# Patient Record
Sex: Female | Born: 1950 | Race: White | Hispanic: No | Smoking: Never smoker
Health system: Southern US, Community
[De-identification: ages and names within clinical notes are randomized; demographics above are authoritative.]

## PROBLEM LIST (undated history)

## (undated) DIAGNOSIS — B1920 Unspecified viral hepatitis C without hepatic coma: Secondary | ICD-10-CM

## (undated) DIAGNOSIS — E119 Type 2 diabetes mellitus without complications: Secondary | ICD-10-CM

## (undated) DIAGNOSIS — M8580 Other specified disorders of bone density and structure, unspecified site: Secondary | ICD-10-CM

## (undated) DIAGNOSIS — R42 Dizziness and giddiness: Secondary | ICD-10-CM

## (undated) DIAGNOSIS — R0989 Other specified symptoms and signs involving the circulatory and respiratory systems: Secondary | ICD-10-CM

## (undated) DIAGNOSIS — R011 Cardiac murmur, unspecified: Secondary | ICD-10-CM

## (undated) HISTORY — PX: TUBAL LIGATION: SHX77

## (undated) HISTORY — DX: Unspecified viral hepatitis C without hepatic coma: B19.20

## (undated) HISTORY — DX: Other specified symptoms and signs involving the circulatory and respiratory systems: R09.89

## (undated) HISTORY — DX: Other specified disorders of bone density and structure, unspecified site: M85.80

## (undated) HISTORY — DX: Type 2 diabetes mellitus without complications: E11.9

---

## 1998-09-18 HISTORY — PX: EXCISIONAL HEMORRHOIDECTOMY: SHX1541

## 2003-07-17 ENCOUNTER — Encounter: Payer: Self-pay | Admitting: Internal Medicine

## 2005-10-23 ENCOUNTER — Ambulatory Visit: Payer: Self-pay | Admitting: Internal Medicine

## 2007-05-17 ENCOUNTER — Ambulatory Visit: Payer: Self-pay | Admitting: Internal Medicine

## 2009-03-15 ENCOUNTER — Ambulatory Visit: Payer: Self-pay | Admitting: Internal Medicine

## 2009-03-15 DIAGNOSIS — M949 Disorder of cartilage, unspecified: Secondary | ICD-10-CM

## 2009-03-15 DIAGNOSIS — M2550 Pain in unspecified joint: Secondary | ICD-10-CM | POA: Insufficient documentation

## 2009-03-15 DIAGNOSIS — M899 Disorder of bone, unspecified: Secondary | ICD-10-CM | POA: Insufficient documentation

## 2009-03-16 ENCOUNTER — Encounter: Payer: Self-pay | Admitting: Internal Medicine

## 2009-03-16 ENCOUNTER — Encounter (INDEPENDENT_AMBULATORY_CARE_PROVIDER_SITE_OTHER): Payer: Self-pay | Admitting: *Deleted

## 2009-03-17 ENCOUNTER — Encounter (INDEPENDENT_AMBULATORY_CARE_PROVIDER_SITE_OTHER): Payer: Self-pay | Admitting: *Deleted

## 2009-03-18 ENCOUNTER — Encounter (INDEPENDENT_AMBULATORY_CARE_PROVIDER_SITE_OTHER): Payer: Self-pay | Admitting: *Deleted

## 2009-04-13 ENCOUNTER — Encounter: Admission: RE | Admit: 2009-04-13 | Discharge: 2009-04-13 | Payer: Self-pay | Admitting: Internal Medicine

## 2009-04-13 LAB — HM MAMMOGRAPHY: HM Mammogram: NORMAL

## 2009-05-18 ENCOUNTER — Encounter: Payer: Self-pay | Admitting: Internal Medicine

## 2009-05-18 ENCOUNTER — Ambulatory Visit: Payer: Self-pay | Admitting: Family Medicine

## 2009-06-07 ENCOUNTER — Encounter (INDEPENDENT_AMBULATORY_CARE_PROVIDER_SITE_OTHER): Payer: Self-pay | Admitting: *Deleted

## 2010-10-09 ENCOUNTER — Encounter: Payer: Self-pay | Admitting: Internal Medicine

## 2010-10-10 ENCOUNTER — Encounter: Payer: Self-pay | Admitting: Internal Medicine

## 2010-10-16 LAB — CONVERTED CEMR LAB
ALT: 17 units/L (ref 0–35)
BUN: 8 mg/dL (ref 6–23)
Basophils Absolute: 0 10*3/uL (ref 0.0–0.1)
Bilirubin, Direct: 0.1 mg/dL (ref 0.0–0.3)
Calcium: 10.1 mg/dL (ref 8.4–10.5)
Chloride: 105 meq/L (ref 96–112)
Creatinine, Ser: 0.7 mg/dL (ref 0.4–1.2)
GFR calc non Af Amer: 91.97 mL/min (ref >60)
Glucose, Bld: 90 mg/dL (ref 70–99)
HCT: 32.8 % — ABNORMAL LOW (ref 36.0–46.0)
Hemoglobin: 10.5 g/dL — ABNORMAL LOW (ref 12.0–15.0)
Hgb A1c MFr Bld: 6.4 % (ref 4.6–6.5)
MCHC: 32.1 g/dL (ref 30.0–36.0)
MCV: 81 fL (ref 78.0–100.0)
Neutro Abs: 3.3 10*3/uL (ref 1.4–7.7)
Platelets: 172 10*3/uL (ref 150.0–400.0)
Potassium: 4.3 meq/L (ref 3.5–5.1)
RBC: 4.06 M/uL (ref 3.87–5.11)
RDW: 15.7 % — ABNORMAL HIGH (ref 11.5–14.6)
Sed Rate: 22 mm/hr (ref 0–22)
Uric Acid, Serum: 7.2 mg/dL — ABNORMAL HIGH (ref 2.4–7.0)
VLDL: 17.6 mg/dL (ref 0.0–40.0)
WBC: 6.8 10*3/uL (ref 4.5–10.5)

## 2011-01-16 ENCOUNTER — Ambulatory Visit (INDEPENDENT_AMBULATORY_CARE_PROVIDER_SITE_OTHER): Payer: Self-pay | Admitting: Family

## 2011-01-16 ENCOUNTER — Encounter: Payer: Self-pay | Admitting: Family

## 2011-01-16 VITALS — BP 118/70 | HR 78 | Temp 98.0°F | Resp 16 | Ht 65.98 in | Wt 135.0 lb

## 2011-01-16 DIAGNOSIS — R300 Dysuria: Secondary | ICD-10-CM

## 2011-01-16 DIAGNOSIS — R3915 Urgency of urination: Secondary | ICD-10-CM

## 2011-01-16 LAB — POCT URINALYSIS DIPSTICK
Glucose, UA: NEGATIVE
Ketones, UA: NEGATIVE
Leukocytes, UA: NEGATIVE
Nitrite, UA: NEGATIVE
pH, UA: 6.5

## 2011-01-16 MED ORDER — CIPROFLOXACIN HCL 500 MG PO TABS: 500.0000 mg | ORAL_TABLET | Freq: Two times a day (BID) | ORAL | Status: AC

## 2011-01-16 NOTE — Progress Notes (Signed)
  Subjective:    Patient ID: Natalie Rush, female    DOB: 1950-12-11, 60 y.o.   MRN: [id number]  HPI  Ms.  Rush is a 60 yr old female who presents with complaint of dysuria and left sided abdominal pain.  Symptoms are intermittent in nature and started about 1 week ago. She has had + frequency.Denies associated fever, or hematuria.  Denies hx of kidney stones, but both of her brothers have hx of stones.  She has not tried any medication for the pain.  She reports that her bowel movements have been normal and regular she uses metamucil daily to help prevent constipation.  Pt is a Naval architect.  Review of Systems See HPI  Past Medical History  Diagnosis Date  . 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 Osteopenia     History   Social History  . Marital Status: Divorced    Spouse Name: N/A    Number of Children: N/A  . 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 Smoking status: Never Smoker   . Smokeless tobacco: Not on file  . Alcohol Use: Yes     rarely  . Drug Use: Not on file  . Sexually Active: Not on file   Other Topics Concern  . Not on file   Social History Narrative   Regular exercise:  No    Past Surgical History  Procedure Date  . Abdominal hysterectomy   . Tubal ligation   . Excisional hemorrhoidectomy 2000    Family History  Problem Relation Age of Onset  . Diabetes Mother   . Heart disease Mother     CAD  . Alcohol abuse Father   . Hypertension Father   . Hepatitis Father   . Multiple myeloma Sister   . Diabetes Brother     Allergies  Allergen Reactions  . Demerol Nausea Only    No current outpatient prescriptions on file prior to visit.    BP 118/70  Pulse 78  Temp(Src) 98 F (36.7 C) (Oral)  Resp 16  Ht 5' 5.98" (1.676 m)  Wt 135 lb 0.6 oz (61.254 kg)  BMI 21.81 kg/m2       Objective:   Physical Exam  Constitutional:  She appears well-developed and well-nourished.  HENT:  Head: Normocephalic and atraumatic.  Cardiovascular: Normal rate and regular rhythm.   Pulmonary/Chest: Effort normal and breath sounds normal.  Abdominal: Soft. Bowel sounds are normal. She exhibits no distension and no mass. There is no tenderness. There is no rebound and no guarding.  Musculoskeletal:       Neg CVAT bilaterally.  No palpable bladder distension on exam.         Assessment & Plan:

## 2011-01-16 NOTE — Patient Instructions (Signed)
Call if your symptoms worsen or if they do not improve.  Call if you develop fever >100 degrees, nausea or vomitting.

## 2011-01-16 NOTE — Assessment & Plan Note (Signed)
UA unremarkable, (specifically negative for blood).  Will plan empiric treatment with cipro x 3 days.  (urine will be cultured)   Neg CVAT today, neg abdominal tenderness.  If symptoms worsen or do not improve, consider CT abdomen.  Case was discussed with Dr. Artist Pais.

## 2011-01-17 ENCOUNTER — Other Ambulatory Visit: Payer: Self-pay | Admitting: Family

## 2011-01-19 LAB — URINE CULTURE
Colony Count: NO GROWTH
Organism ID, Bacteria: NO GROWTH

## 2015-05-08 ENCOUNTER — Emergency Department (HOSPITAL_COMMUNITY): Payer: Worker's Compensation

## 2015-05-08 ENCOUNTER — Encounter (HOSPITAL_COMMUNITY): Payer: Self-pay | Admitting: Emergency Medicine

## 2015-05-08 DIAGNOSIS — S199XXA Unspecified injury of neck, initial encounter: Secondary | ICD-10-CM | POA: Diagnosis present

## 2015-05-08 DIAGNOSIS — Y998 Other external cause status: Secondary | ICD-10-CM | POA: Insufficient documentation

## 2015-05-08 DIAGNOSIS — Y9389 Activity, other specified: Secondary | ICD-10-CM | POA: Diagnosis not present

## 2015-05-08 DIAGNOSIS — M62838 Other muscle spasm: Secondary | ICD-10-CM | POA: Insufficient documentation

## 2015-05-08 DIAGNOSIS — Y9289 Other specified places as the place of occurrence of the external cause: Secondary | ICD-10-CM | POA: Diagnosis not present

## 2015-05-08 DIAGNOSIS — W208XXA Other cause of strike by thrown, projected or falling object, initial encounter: Secondary | ICD-10-CM | POA: Diagnosis not present

## 2015-05-08 DIAGNOSIS — S0990XA Unspecified injury of head, initial encounter: Secondary | ICD-10-CM | POA: Diagnosis not present

## 2015-05-08 NOTE — ED Notes (Signed)
Patient here with complaint of neck pain and head fullness. 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 Yesterday she had a very heavy (>200lbs box) fall out of her truck when she opened the doors. She attempted to stop the box from falling but it was too heavy and landed on top of her head. Now she can barely turn her head and her head "feels full". Philly Collar applied in triage.

## 2015-05-09 ENCOUNTER — Emergency Department (HOSPITAL_COMMUNITY)
Admission: EM | Admit: 2015-05-09 | Discharge: 2015-05-09 | Disposition: A | Discharge status: Home / Self Care | Payer: Worker's Compensation | Attending: Emergency Medicine | Admitting: Emergency Medicine

## 2015-05-09 ENCOUNTER — Encounter (HOSPITAL_COMMUNITY): Payer: Self-pay | Admitting: Emergency Medicine

## 2015-05-09 DIAGNOSIS — M62838 Other muscle spasm: Secondary | ICD-10-CM

## 2015-05-09 MED ORDER — METHOCARBAMOL 500 MG PO TABS
1000.0000 mg | ORAL_TABLET | Freq: Once | ORAL | Status: AC
  Administered 2015-05-09: 1000 mg via ORAL
  Filled 2015-05-09: qty 2

## 2015-05-09 MED ORDER — KETOROLAC TROMETHAMINE 60 MG/2ML IM SOLN
60.0000 mg | Freq: Once | INTRAMUSCULAR | Status: AC
  Administered 2015-05-09: 60 mg via INTRAMUSCULAR
  Filled 2015-05-09: qty 2

## 2015-05-09 MED ORDER — METHOCARBAMOL 500 MG PO TABS: 500.0000 mg | ORAL_TABLET | Freq: Three times a day (TID) | ORAL | Status: DC

## 2015-05-09 MED ORDER — MELOXICAM 7.5 MG PO TABS: 7.5000 mg | ORAL_TABLET | Freq: Every day | ORAL | Status: DC

## 2015-05-09 NOTE — ED Provider Notes (Signed)
CSN: [id number]     Arrival date & time 05/08/15  2136 History  This chart was scribed for Barbie Croston, MD by Evelene Croon, ED Scribe. This patient was seen in room D32C/D32C and the patient's care was started 12:59 AM.   Chief Complaint  Patient presents with  . Neck Pain  . Headache     Patient is a 64 y.o. female presenting with neck injury. The history is provided by the patient. No language interpreter was used.  Neck Injury This is a new problem. The current episode started yesterday. The problem occurs constantly. The problem has not changed since onset.Associated symptoms include headaches. Pertinent negatives include no chest pain, no abdominal pain and no shortness of breath. Nothing aggravates the symptoms. She has tried nothing for the symptoms. The treatment provided no relief.     HPI Comments:  Natalie Rush is a 64 y.o. female who presents to the Emergency Department complaining of constant moderate neck pain and HA s/p injury on 05/07/15. Pt states a heavy (~200 pounds) steel cabinet fell on the top of her head. She denies LOC, weakness/numbness of her extremities and dizziness. No alleviating factors noted.   Past Medical History  Diagnosis Date  . 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 Osteopenia    Past Surgical History  Procedure Laterality Date  . Abdominal hysterectomy    . Tubal ligation    . Excisional hemorrhoidectomy  2000   Family History  Problem Relation Age of Onset  . Diabetes Mother   . Heart disease Mother     CAD  . Alcohol abuse Father   . Hypertension Father   . Hepatitis Father   . Multiple myeloma Sister   . Diabetes Brother    Social History  Substance Use Topics  . Smoking status: Never Smoker   . Smokeless tobacco: None  . Alcohol Use: Yes     Comment: rarely   OB History    No data available     Review of Systems  Respiratory: Negative for shortness of breath.   Cardiovascular: Negative  for chest pain.  Gastrointestinal: Negative for abdominal pain.  Musculoskeletal: Positive for neck pain. Negative for gait problem and neck stiffness.  Neurological: Positive for headaches. Negative for dizziness, tremors, seizures, facial asymmetry, speech difficulty, weakness, light-headedness and numbness.  All other systems reviewed and are negative.     Allergies  Demerol  Home Medications   Prior to Admission medications   Not on File   BP 138/85 mmHg  Pulse 67  Temp(Src) 97.8 F (36.6 C) (Oral)  Resp 18  Ht 5' 6"  (1.676 m)  Wt 127 lb (57.607 kg)  BMI 20.51 kg/m2  SpO2 99% Physical Exam  Constitutional: She is oriented to person, place, and time. She appears well-developed and well-nourished. No distress.  HENT:  Head: Normocephalic and atraumatic.  Right Ear: No hemotympanum.  Left Ear: No hemotympanum.  Mouth/Throat: Oropharynx is clear and moist.  Moist mucous membranes   Eyes: Conjunctivae are normal. Pupils are equal, round, and reactive to light.  Neck: Neck supple. No tracheal deviation present.  Trachea midline  Cardiovascular: Normal rate, regular rhythm and normal heart sounds.   Pulmonary/Chest: Effort normal and breath sounds normal. No respiratory distress. She has no wheezes. She has no rales.  Abdominal: Soft. Bowel sounds are normal. She exhibits no distension. There is no tenderness. There is no rebound and no guarding.  Genitourinary:  Trapezuis spasm L>R; starting at the occiput Hips well seated   Musculoskeletal: Normal range of motion. She exhibits no edema or tenderness.  Pelvis stable.  No step offs or crepitance or point tenderness of the C t or l spine  Neurological: She is alert and oriented to person, place, and time. She has normal reflexes. No cranial nerve deficit.  Skin: Skin is warm and dry.  Psychiatric: She has a normal mood and affect. Her behavior is normal.  Nursing note and vitals reviewed.   ED Course  Procedures    DIAGNOSTIC STUDIES:  Oxygen Saturation is 95% on RA, adequate by my interpretation.    COORDINATION OF CARE:  1:05 AM Discussed treatment plan with pt at bedside and pt agreed to plan.  Labs Review Labs Reviewed - No data to display  Imaging Review Ct Head Wo Contrast  05/08/2015   CLINICAL DATA:  Headache and difficulty turning neck after blunt trauma yesterday. Initial encounter.  EXAM: CT HEAD WITHOUT CONTRAST  CT CERVICAL SPINE WITHOUT CONTRAST  TECHNIQUE: Multidetector CT imaging of the head and cervical spine was performed following the standard protocol without intravenous contrast. Multiplanar CT image reconstructions of the cervical spine were also generated.  COMPARISON:  None.  FINDINGS: CT HEAD FINDINGS  Skull and Sinuses:Negative for fracture. 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 The mastoids, middle ears, and imaged paranasal sinuses are clear.  Orbits: No acute abnormality.  Brain: No infarction, hemorrhage, hydrocephalus, or mass lesion/mass effect. Prominent folia in the upper cerebellum compatible with early atrophy.  CT CERVICAL SPINE FINDINGS  Negative for acute fracture or subluxation. No prevertebral edema. No gross cervical canal hematoma. Lower cervical disc narrowing without significant canal stenosis. Notable bilateral uncovertebral spurring at C5-6 and C6-7 with foraminal stenoses.  IMPRESSION: No evidence of intracranial or cervical spine injury.   Electronically Signed   By: Monte Fantasia M.D.   On: 05/08/2015 23:32   Ct Cervical Spine Wo Contrast  05/08/2015   CLINICAL DATA:  Headache and difficulty turning neck after blunt trauma yesterday. Initial encounter.  EXAM: CT HEAD WITHOUT CONTRAST  CT CERVICAL SPINE WITHOUT CONTRAST  TECHNIQUE: Multidetector CT imaging of the head and cervical spine was performed following the standard protocol without intravenous contrast. Multiplanar CT image reconstructions of the cervical spine were also generated.   COMPARISON:  None.  FINDINGS: CT HEAD FINDINGS  Skull and Sinuses:Negative for fracture. 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 The mastoids, middle ears, and imaged paranasal sinuses are clear.  Orbits: No acute abnormality.  Brain: No infarction, hemorrhage, hydrocephalus, or mass lesion/mass effect. Prominent folia in the upper cerebellum compatible with early atrophy.  CT CERVICAL SPINE FINDINGS  Negative for acute fracture or subluxation. No prevertebral edema. No gross cervical canal hematoma. Lower cervical disc narrowing without significant canal stenosis. Notable bilateral uncovertebral spurring at C5-6 and C6-7 with foraminal stenoses.  IMPRESSION: No evidence of intracranial or cervical spine injury.   Electronically Signed   By: Monte Fantasia M.D.   On: 05/08/2015 23:32   I have personally reviewed and evaluated these images and lab results as part of my medical decision-making.   EKG Interpretation None      MDM   Final diagnoses:  None    Muscle relaxant and pain medication and close follow up, No ETOH.    I personally performed the services described in this documentation, which was scribed in my presence. The recorded information has been reviewed and is accurate.  Veatrice Kells, MD 05/09/15 [phone]

## 2015-05-09 NOTE — Discharge Instructions (Signed)
Heat Therapy °Heat therapy can help make painful, stiff muscles and joints feel better. Do not use heat on new injuries. Wait at least 48 hours after an injury to use heat. Do not use heat when you have aches or pains right after an activity. If you still have pain 3 hours after stopping the activity, then you may use heat. °HOME CARE °Wet heat pack °· Soak a clean towel in warm water. Squeeze out the extra water. °· Put the warm, wet towel in a plastic bag. °· Place a thin, dry towel between your skin and the bag. °· Put the heat pack on the area for 5 minutes, and check your skin. Your skin may be pink, but it should not be red. °· Leave the heat pack on the area for 15 to 30 minutes. °· Repeat this every 2 to 4 hours while awake. Do not use heat while you are sleeping. °Warm water bath °· Fill a tub with warm water. °· Place the affected body part in the tub. °· Soak the area for 20 to 40 minutes. °· Repeat as needed. °Hot water bottle °· Fill the water bottle half full with hot water. °· Press out the extra air. Close the cap tightly. °· Place a dry towel between your skin and the bottle. °· Put the bottle on the area for 5 minutes, and check your skin. Your skin may be pink, but it should not be red. °· Leave the bottle on the area for 15 to 30 minutes. °· Repeat this every 2 to 4 hours while awake. °Electric heating pad °· Place a dry towel between your skin and the heating pad. °· Set the heating pad on low heat. °· Put the heating pad on the area for 10 minutes, and check your skin. Your skin may be pink, but it should not be red. °· Leave the heating pad on the area for 20 to 40 minutes. °· Repeat this every 2 to 4 hours while awake. °· Do not lie on the heating pad. °· Do not fall asleep while using the heating pad. °· Do not use the heating pad near water. °GET HELP RIGHT AWAY IF: °· You get blisters or red skin. °· Your skin is puffy (swollen), or you lose feeling (numbness) in the affected area. °· You  have any new problems. °· Your problems are getting worse. °· You have any questions or concerns. °If you have any problems, stop using heat therapy until you see your doctor. °MAKE SURE YOU: °· Understand these instructions. °· Will watch your condition. °· Will get help right away if you are not doing well or get worse. °Document Released: 11/27/2011 Document Reviewed: 10/28/2013 °Exit[employer]® Patient Information ©2015 [employer]. This information is not intended to replace advice given to you by your health care provider. Make sure you discuss any questions you have with your health care provider. ° °

## 2017-06-05 DIAGNOSIS — H1045 Other chronic allergic conjunctivitis: Secondary | ICD-10-CM | POA: Diagnosis not present

## 2017-06-05 DIAGNOSIS — H25011 Cortical age-related cataract, right eye: Secondary | ICD-10-CM | POA: Diagnosis not present

## 2018-01-21 DIAGNOSIS — N3091 Cystitis, unspecified with hematuria: Secondary | ICD-10-CM | POA: Diagnosis not present

## 2018-04-10 ENCOUNTER — Telehealth: Payer: Self-pay

## 2018-04-10 NOTE — Telephone Encounter (Signed)
Copied from CRM [id number]#[id number]. Topic: Appointment Scheduling - Scheduling Inquiry for Clinic >> Apr 10, 2018  9:02 AM Oneal GroutSebastian, Jennifer S wrote: Reason for CRM: Sister of Carolan ClinesMaybelle Worley, was told Dr Para Marchuncan would accept her as a new patient. Please advise.  Dr Para Marchuncan is this ok?-Anastasiya Ander PurpuraV Hopkins, RMA

## 2018-04-11 NOTE — Telephone Encounter (Signed)
Appointment made by [facility].-Nyimah Shadduck V Sharnell Knight, RMA  

## 2018-04-11 NOTE — Telephone Encounter (Signed)
Yes, 30min OV when possible.  Thanks.  

## 2018-04-11 NOTE — Telephone Encounter (Signed)
Left message for patient to call back and schedule-Anastasiya V Hopkins, RMA  

## 2018-05-27 ENCOUNTER — Encounter: Payer: Self-pay | Admitting: Family Medicine

## 2018-05-27 ENCOUNTER — Ambulatory Visit: Payer: BLUE CROSS/BLUE SHIELD | Admitting: Family Medicine

## 2018-05-27 ENCOUNTER — Encounter (INDEPENDENT_AMBULATORY_CARE_PROVIDER_SITE_OTHER): Payer: Self-pay

## 2018-05-27 VITALS — BP 122/70 | HR 76 | Temp 98.4°F | Ht 65.5 in | Wt 144.5 lb

## 2018-05-27 DIAGNOSIS — Z1159 Encounter for screening for other viral diseases: Secondary | ICD-10-CM | POA: Diagnosis not present

## 2018-05-27 DIAGNOSIS — M2669 Other specified disorders of temporomandibular joint: Secondary | ICD-10-CM

## 2018-05-27 DIAGNOSIS — N941 Unspecified dyspareunia: Secondary | ICD-10-CM

## 2018-05-27 DIAGNOSIS — Z7189 Other specified counseling: Secondary | ICD-10-CM

## 2018-05-27 DIAGNOSIS — R739 Hyperglycemia, unspecified: Secondary | ICD-10-CM

## 2018-05-27 DIAGNOSIS — M26649 Arthritis of unspecified temporomandibular joint: Secondary | ICD-10-CM

## 2018-05-27 DIAGNOSIS — Z0000 Encounter for general adult medical examination without abnormal findings: Secondary | ICD-10-CM

## 2018-05-27 DIAGNOSIS — B1920 Unspecified viral hepatitis C without hepatic coma: Secondary | ICD-10-CM

## 2018-05-27 DIAGNOSIS — M26629 Arthralgia of temporomandibular joint, unspecified side: Secondary | ICD-10-CM

## 2018-05-27 DIAGNOSIS — Z114 Encounter for screening for human immunodeficiency virus [HIV]: Secondary | ICD-10-CM | POA: Diagnosis not present

## 2018-05-27 DIAGNOSIS — Z13220 Encounter for screening for lipoid disorders: Secondary | ICD-10-CM

## 2018-05-27 LAB — LIPID PANEL
Cholesterol: 127 mg/dL (ref 0–200)
HDL: 52.1 mg/dL (ref >39.00)
LDL CALC: 63 mg/dL (ref 0–99)
NonHDL: 74.88
TRIGLYCERIDES: 59 mg/dL (ref 0.0–149.0)
Total CHOL/HDL Ratio: 2
VLDL: 11.8 mg/dL (ref 0.0–40.0)

## 2018-05-27 LAB — BASIC METABOLIC PANEL
BUN: 14 mg/dL (ref 6–23)
CHLORIDE: 105 meq/L (ref 96–112)
CO2: 27 mEq/L (ref 19–32)
Calcium: 10.2 mg/dL (ref 8.4–10.5)
Creatinine, Ser: 0.84 mg/dL (ref 0.40–1.20)
GFR: 72.26 mL/min (ref >60.00)
Glucose, Bld: 122 mg/dL — ABNORMAL HIGH (ref 70–99)
POTASSIUM: 4.1 meq/L (ref 3.5–5.1)
SODIUM: 141 meq/L (ref 135–145)

## 2018-05-27 NOTE — Patient Instructions (Addendum)
You can call for a mammogram at [facility] [address] [place] [address] [place]  I would get a flu shot each fall.    Go to the lab on the way out.  We'll contact you with your lab report. Take care.  Glad to see you.  Don't take ibuprofen more then 3 times per day.

## 2018-05-27 NOTE — Progress Notes (Signed)
New patient.   Taking ibuprofen up to 800mg  4 times a day.  H/o sig L TMJ pain.  Head injury a few years ago with a steel box falling on her head.  Has sig neck pain at the time.  D/w pt about checking labs given nsaid use.  See notes on labs.  Routine NSAID cautions discussed with patient.  Pt opts in for HCV screening.  D/w pt re: routine screening.   Pt opts in for HIV screening.  D/w pt re: routine screening.   Flu shot encouraged.  Tetanus d/w pt.  I'll check old records, d/w pt.  Mammogram d/w pt. See avs.    D/w pt about lipid screening.  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 Living will d/w pt.  Sister Maybelle designated if patient were incapacitated.   Intially 3 biological children, 1 of whom committed suicide 2005.  Patient is divorced.    She has some positional pain with intercourse, not with all positions and not all of the time. No abd pain o/w.  She has used lubricants, d/w pt.  No blood in stool.  No VB. Not an acute issue.  H/o distant abd surgery noted.   PMH and SH reviewed  ROS: Per HPI unless specifically indicated in ROS section   Meds, vitals, and allergies reviewed.   GEN: nad, alert and oriented HEENT: mucous membranes moist, bilateral TMJ crepitus with range of motion without local erythema or bruising. NECK: supple w/o LA CV: rrr PULM: ctab, no inc wob ABD: soft, +bs EXT: no edema SKIN: no acute rash

## 2018-05-30 LAB — HEPATITIS C ANTIBODY
HEP C AB: REACTIVE — AB
SIGNAL TO CUT-OFF: 33 — AB (ref <1.00)

## 2018-05-30 LAB — HCV RNA,QUANTITATIVE REAL TIME PCR
HCV QUANT LOG: 3.78 {Log_IU}/mL — AB
HCV RNA, PCR, QN: 5990 [IU]/mL — AB

## 2018-05-30 LAB — HIV ANTIBODY (ROUTINE TESTING W REFLEX): HIV 1&2 Ab, 4th Generation: NONREACTIVE

## 2018-05-31 ENCOUNTER — Other Ambulatory Visit: Payer: Self-pay | Admitting: Family Medicine

## 2018-05-31 ENCOUNTER — Telehealth: Payer: Self-pay | Admitting: Family Medicine

## 2018-05-31 DIAGNOSIS — Z7189 Other specified counseling: Secondary | ICD-10-CM | POA: Insufficient documentation

## 2018-05-31 DIAGNOSIS — Z0000 Encounter for general adult medical examination without abnormal findings: Secondary | ICD-10-CM | POA: Insufficient documentation

## 2018-05-31 DIAGNOSIS — B1920 Unspecified viral hepatitis C without hepatic coma: Secondary | ICD-10-CM | POA: Insufficient documentation

## 2018-05-31 DIAGNOSIS — M26629 Arthralgia of temporomandibular joint, unspecified side: Secondary | ICD-10-CM | POA: Insufficient documentation

## 2018-05-31 DIAGNOSIS — N941 Unspecified dyspareunia: Secondary | ICD-10-CM | POA: Insufficient documentation

## 2018-05-31 DIAGNOSIS — M2669 Other specified disorders of temporomandibular joint: Principal | ICD-10-CM

## 2018-05-31 DIAGNOSIS — M26649 Arthritis of unspecified temporomandibular joint: Secondary | ICD-10-CM

## 2018-05-31 NOTE — Assessment & Plan Note (Signed)
This is not consistent.  It only happens with certain positions.  Discussed with patient about using lubricants.  She has no other alarming symptoms otherwise.

## 2018-05-31 NOTE — Assessment & Plan Note (Signed)
Pt opts in for HCV screening.  D/w pt re: routine screening.   Pt opts in for HIV screening.  D/w pt re: routine screening.   Flu shot encouraged.  Tetanus d/w pt.  I'll check old records, d/w pt.  Mammogram d/w pt.  D/w pt about lipid screening.  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 Living will d/w pt.  Sister Maybelle designated if patient were incapacitated.

## 2018-05-31 NOTE — Assessment & Plan Note (Addendum)
Advised to limit ibuprofen to a safe amount.  Recheck creatinine given NSAID use.  NSAID cautions discussed with patient. >30 minutes spent in face to face time with patient, >50% spent in counselling or coordination of care, discussing TMJ pain, routine labs, dyspareunia, etc.

## 2018-05-31 NOTE — Assessment & Plan Note (Signed)
Living will d/w pt.  Sister Maybelle designated if patient were incapacitated.   

## 2018-05-31 NOTE — Assessment & Plan Note (Signed)
See notes on labs. 

## 2018-05-31 NOTE — Telephone Encounter (Signed)
This note was copied and put in 05/27/18 result note.

## 2018-05-31 NOTE — Telephone Encounter (Signed)
Pt given 9/9/ results per notes of Dr. Para Marchuncan on 05/31/18. Pt verbalized understanding.Unable to document in result note. Pt scheduled for appt to discuss Hep C diagnosis on 9/19.

## 2018-06-02 ENCOUNTER — Other Ambulatory Visit: Payer: Self-pay | Admitting: Family Medicine

## 2018-06-02 DIAGNOSIS — B1920 Unspecified viral hepatitis C without hepatic coma: Secondary | ICD-10-CM

## 2018-06-06 ENCOUNTER — Encounter: Payer: Self-pay | Admitting: Family Medicine

## 2018-06-06 ENCOUNTER — Ambulatory Visit: Payer: BLUE CROSS/BLUE SHIELD | Admitting: Family Medicine

## 2018-06-06 DIAGNOSIS — R739 Hyperglycemia, unspecified: Secondary | ICD-10-CM

## 2018-06-06 DIAGNOSIS — B1920 Unspecified viral hepatitis C without hepatic coma: Secondary | ICD-10-CM | POA: Diagnosis not present

## 2018-06-06 LAB — HEMOGLOBIN A1C: HEMOGLOBIN A1C: 6.6 % — AB (ref 4.6–6.5)

## 2018-06-06 LAB — CBC WITH DIFFERENTIAL/PLATELET
Basophils Absolute: 0.1 10*3/uL (ref 0.0–0.1)
Basophils Relative: 1 % (ref 0.0–3.0)
EOS ABS: 0.1 10*3/uL (ref 0.0–0.7)
Eosinophils Relative: 1.2 % (ref 0.0–5.0)
HCT: 40.9 % (ref 36.0–46.0)
HEMOGLOBIN: 13.9 g/dL (ref 12.0–15.0)
LYMPHS PCT: 28.7 % (ref 12.0–46.0)
Lymphs Abs: 2.1 10*3/uL (ref 0.7–4.0)
MCHC: 34 g/dL (ref 30.0–36.0)
MCV: 91.9 fl (ref 78.0–100.0)
MONO ABS: 0.7 10*3/uL (ref 0.1–1.0)
Monocytes Relative: 9.2 % (ref 3.0–12.0)
Neutro Abs: 4.5 10*3/uL (ref 1.4–7.7)
Neutrophils Relative %: 59.9 % (ref 43.0–77.0)
Platelets: 172 10*3/uL (ref 150.0–400.0)
RBC: 4.45 Mil/uL (ref 3.87–5.11)
RDW: 12.6 % (ref 11.5–15.5)
WBC: 7.4 10*3/uL (ref 4.0–10.5)

## 2018-06-06 LAB — HEPATIC FUNCTION PANEL
ALT: 19 U/L (ref 0–35)
AST: 28 U/L (ref 0–37)
Albumin: 4.7 g/dL (ref 3.5–5.2)
Alkaline Phosphatase: 54 U/L (ref 39–117)
BILIRUBIN DIRECT: 0.1 mg/dL (ref 0.0–0.3)
BILIRUBIN TOTAL: 0.5 mg/dL (ref 0.2–1.2)
TOTAL PROTEIN: 8.3 g/dL (ref 6.0–8.3)

## 2018-06-06 NOTE — Patient Instructions (Signed)
We will call about your referral.  Marion or Anastasiya will call you if you don't see one of them on the way out.  Go to the lab on the way out.  We'll contact you with your lab report. Take care.  Glad to see you.  

## 2018-06-06 NOTE — Progress Notes (Signed)
Hepatitis C follow-up.  Never had a transfusion, no tattoos.  No abd pain. No vomiting.  No h/o jaundice.  Discussed with patient about recent labs.   Mild inc in sugar noted.  A1c pending.  See notes on labs.  Meds, vitals, and allergies reviewed.   ROS: Per HPI unless specifically indicated in ROS section   GEN: nad, alert and oriented HEENT: mucous membranes moist NECK: supple w/o LA CV: rrr. PULM: ctab, no inc wob ABD: soft, +bs EXT: no edema SKIN: no acute rash, no jaundice

## 2018-06-07 DIAGNOSIS — R739 Hyperglycemia, unspecified: Secondary | ICD-10-CM | POA: Insufficient documentation

## 2018-06-07 NOTE — Assessment & Plan Note (Signed)
Discussed with patient about diagnosis and pathophysiology and plan.  Unclear duration of infection.  Routine cautions given.  See above.  She feels well otherwise.  Check genotype and LFTs today.  Refer to hematology.  Check hepatitis A and B status.  She has not yet had any liver imaging.  I will defer to hepatology clinic. HIV neg.   Discussed with patient about treatment options.  I have every hope and expectation that this will be a treatable condition that the hepatology clinic can manage.  I appreciate the help of all involved.  All questions answered to the best my ability.

## 2018-06-07 NOTE — Assessment & Plan Note (Signed)
See notes on labs. 

## 2018-06-09 ENCOUNTER — Encounter: Payer: Self-pay | Admitting: Family Medicine

## 2018-06-09 DIAGNOSIS — Z8639 Personal history of other endocrine, nutritional and metabolic disease: Secondary | ICD-10-CM | POA: Insufficient documentation

## 2018-06-09 DIAGNOSIS — E119 Type 2 diabetes mellitus without complications: Secondary | ICD-10-CM | POA: Insufficient documentation

## 2018-06-09 LAB — HEPATITIS A ANTIBODY, TOTAL: Hepatitis A AB,Total: NONREACTIVE

## 2018-06-09 LAB — HEPATITIS C GENOTYPE: HCV Genotype: 2

## 2018-06-09 LAB — HEPATITIS B SURFACE ANTIGEN: Hepatitis B Surface Ag: NONREACTIVE

## 2018-06-09 LAB — HEPATITIS B SURFACE ANTIBODY, QUANTITATIVE: Hepatitis B-Post: 279 m[IU]/mL (ref >=10)

## 2018-06-17 DIAGNOSIS — E119 Type 2 diabetes mellitus without complications: Secondary | ICD-10-CM | POA: Diagnosis not present

## 2018-06-17 DIAGNOSIS — H16041 Marginal corneal ulcer, right eye: Secondary | ICD-10-CM | POA: Diagnosis not present

## 2018-06-17 DIAGNOSIS — H25011 Cortical age-related cataract, right eye: Secondary | ICD-10-CM | POA: Diagnosis not present

## 2018-06-19 LAB — HM DIABETES EYE EXAM

## 2018-06-21 ENCOUNTER — Encounter: Payer: Self-pay | Admitting: Family Medicine

## 2018-06-21 ENCOUNTER — Ambulatory Visit: Payer: BLUE CROSS/BLUE SHIELD | Admitting: Family Medicine

## 2018-06-21 VITALS — BP 110/70 | HR 72 | Temp 98.6°F | Ht 65.5 in | Wt 140.0 lb

## 2018-06-21 DIAGNOSIS — E119 Type 2 diabetes mellitus without complications: Secondary | ICD-10-CM | POA: Diagnosis not present

## 2018-06-21 DIAGNOSIS — B1920 Unspecified viral hepatitis C without hepatic coma: Secondary | ICD-10-CM

## 2018-06-21 NOTE — Patient Instructions (Addendum)
If you don't hear from the hepatitis clinic by the end of next week, then let us know.  Recheck in about 3 months here.  The only lab you need to have done for your next diabetic visit is an A1c.  We can do this with a fingerstick test at the office visit.  You do not need a lab visit ahead of time for this.  It does not matter if you are fasting when the lab is done.   Use the eat right diet in the meantime.  Update me as needed.  Take care.  Glad to see you.

## 2018-06-21 NOTE — Progress Notes (Signed)
She is awaiting f/u re: HCV, awaiting scheduling from the hep clinic.  See avs.    Diabetes:  Path/phys d/w pt.  Hypoglycemia cautions d/w pt.   She cut out bread, cereals, pasta, potatoes.  He feels better with that change.    PMH and SH reviewed  Meds, vitals, and allergies reviewed.   ROS: Per HPI unless specifically indicated in ROS section   GEN: nad, alert and oriented HEENT: mucous membranes moist NECK: supple w/o LA CV: rrr. PULM: ctab, no inc wob ABD: soft, +bs EXT: no edema SKIN: no acute rash  Diabetic foot exam: Normal inspection No skin breakdown No calluses  Normal DP pulses Normal sensation to light touch and monofilament Nails normal

## 2018-06-23 NOTE — Assessment & Plan Note (Addendum)
Path/phys d/w pt.  Hypoglycemia cautions d/w pt.   She cut out bread, cereals, pasta, potatoes.  He feels better with that change.   Low carbohydrate/eat right diet handout discussed with patient.  Handout given to patient.  That may be the only intervention she needs.  If her A1c drops some she may not be diabetic at all.  Recheck in 3 months.  See after visit summary.  Update me as needed.  Previous labs discussed with patient.  >25 minutes spent in face to face time with patient, >50% spent in counselling or coordination of care.

## 2018-06-23 NOTE — Assessment & Plan Note (Signed)
She will let me know if she does not hear about an appointment at the hepatitis clinic.  I appreciate the help of all involved.

## 2018-06-25 DIAGNOSIS — H16041 Marginal corneal ulcer, right eye: Secondary | ICD-10-CM | POA: Diagnosis not present

## 2018-06-25 DIAGNOSIS — H1820 Unspecified corneal edema: Secondary | ICD-10-CM | POA: Diagnosis not present

## 2018-06-27 ENCOUNTER — Encounter: Payer: Self-pay | Admitting: Family Medicine

## 2018-08-06 DIAGNOSIS — B182 Chronic viral hepatitis C: Secondary | ICD-10-CM | POA: Diagnosis not present

## 2018-08-07 ENCOUNTER — Other Ambulatory Visit: Payer: Self-pay | Admitting: Nurse Practitioner

## 2018-08-07 DIAGNOSIS — B182 Chronic viral hepatitis C: Secondary | ICD-10-CM

## 2018-08-23 ENCOUNTER — Other Ambulatory Visit: Payer: Self-pay

## 2018-08-26 ENCOUNTER — Ambulatory Visit
Admission: RE | Admit: 2018-08-26 | Discharge: 2018-08-26 | Disposition: A | Discharge status: Home / Self Care | Payer: BLUE CROSS/BLUE SHIELD | Source: Ambulatory Visit | Attending: Nurse Practitioner | Admitting: Nurse Practitioner

## 2018-08-26 DIAGNOSIS — B182 Chronic viral hepatitis C: Secondary | ICD-10-CM | POA: Diagnosis not present

## 2018-09-23 DIAGNOSIS — K740 Hepatic fibrosis: Secondary | ICD-10-CM | POA: Diagnosis not present

## 2018-09-23 DIAGNOSIS — B182 Chronic viral hepatitis C: Secondary | ICD-10-CM | POA: Diagnosis not present

## 2018-11-08 DIAGNOSIS — B182 Chronic viral hepatitis C: Secondary | ICD-10-CM | POA: Diagnosis not present

## 2019-01-03 DIAGNOSIS — B182 Chronic viral hepatitis C: Secondary | ICD-10-CM | POA: Diagnosis not present

## 2019-01-06 DIAGNOSIS — K740 Hepatic fibrosis: Secondary | ICD-10-CM | POA: Diagnosis not present

## 2019-01-06 DIAGNOSIS — B182 Chronic viral hepatitis C: Secondary | ICD-10-CM | POA: Diagnosis not present

## 2019-03-10 ENCOUNTER — Ambulatory Visit: Payer: Self-pay

## 2019-03-10 DIAGNOSIS — S9030XA Contusion of unspecified foot, initial encounter: Secondary | ICD-10-CM | POA: Diagnosis not present

## 2019-03-10 NOTE — Telephone Encounter (Signed)
Incoming call from Pt.  Who states that she feels that she broke her toe.  Hit it against a cooler.Has two sticks besides tito keep it straight.  States that some skin may have broken skin.  Unsure when last Tetanus  Shot was.  Pt.  Reports she does have diabetes Reviewed Protocol with patient and provided care advice.   Recommended Patient go to Urgent care to get toe evaluated.  Patient voiced under standing.    Reason for Disposition . Looks like a broken bone (e.g., crooked or deformed)  Answer Assessment - Initial Assessment Questions 1. MECHANISM: "How did the injury happen?"    Broken toe? 2. ONSET: "When did the injury happen?" (Minutes or hours ago)      Last nite hit a cooler when  All the right 3. LOCATION: "What part of the toe is injured?" "Is the nail damaged?"      Nail  Not damaged 4. APPEARANCE of TOE INJURY: "What does the injury look like?"      *No Answer* 5. SEVERITY: "Can you use the foot normally?" "Can you walk?"       Cant put wait on it 6. SIZE: For cuts, bruises, or swelling, ask: "How large is it?" (e.g., inches or centimeters;  entire toe)      Broke skin inbetween  toes 7. PAIN: "Is there pain?" If so, ask: "How bad is the pain?"   (e.g., Scale 1-10; or mild, moderate, severe)     Taped up with 2 sticks  dep 8. TETANUS: For any breaks in the skin, ask: "When was the last tetanus booster?"     A little bit.   9. DIABETES: "Do you have a history of diabetes or poor circulation in the feet?"    yes 10. OTHER SYMPTOMS: "Do you have any other symptoms?"        *No Answer* 11. PREGNANCY: "Is there any chance you are pregnant?" "When was your last menstrual period?"       *No Answer*  Protocols used: TOE INJURY-A-AH

## 2019-03-11 NOTE — Telephone Encounter (Signed)
Noted. Thanks.

## 2019-03-11 NOTE — Telephone Encounter (Signed)
I spoke with pt; pt went to UC in [place]. Pt said would get results from xray today. Pt will cb if needed. FYI to Dr Damita Dunnings.

## 2019-03-19 DIAGNOSIS — M79674 Pain in right toe(s): Secondary | ICD-10-CM | POA: Diagnosis not present

## 2019-03-31 DIAGNOSIS — B182 Chronic viral hepatitis C: Secondary | ICD-10-CM | POA: Diagnosis not present

## 2019-04-03 DIAGNOSIS — B182 Chronic viral hepatitis C: Secondary | ICD-10-CM | POA: Diagnosis not present

## 2019-04-03 DIAGNOSIS — K740 Hepatic fibrosis: Secondary | ICD-10-CM | POA: Diagnosis not present

## 2019-07-22 DIAGNOSIS — H25011 Cortical age-related cataract, right eye: Secondary | ICD-10-CM | POA: Diagnosis not present

## 2019-07-22 DIAGNOSIS — H25013 Cortical age-related cataract, bilateral: Secondary | ICD-10-CM | POA: Diagnosis not present

## 2019-07-22 DIAGNOSIS — E119 Type 2 diabetes mellitus without complications: Secondary | ICD-10-CM | POA: Diagnosis not present

## 2019-07-22 LAB — HM DIABETES EYE EXAM

## 2019-08-19 ENCOUNTER — Other Ambulatory Visit: Payer: Self-pay | Admitting: Nurse Practitioner

## 2019-08-19 DIAGNOSIS — B182 Chronic viral hepatitis C: Secondary | ICD-10-CM

## 2019-09-03 ENCOUNTER — Ambulatory Visit
Admission: RE | Admit: 2019-09-03 | Discharge: 2019-09-03 | Disposition: A | Discharge status: Home / Self Care | Payer: BC Managed Care – PPO | Source: Ambulatory Visit | Attending: Nurse Practitioner | Admitting: Nurse Practitioner

## 2019-09-03 DIAGNOSIS — B182 Chronic viral hepatitis C: Secondary | ICD-10-CM | POA: Diagnosis not present

## 2019-09-03 DIAGNOSIS — K824 Cholesterolosis of gallbladder: Secondary | ICD-10-CM | POA: Diagnosis not present

## 2019-09-04 ENCOUNTER — Encounter: Payer: Self-pay | Admitting: Optometry

## 2019-10-02 DIAGNOSIS — K7401 Hepatic fibrosis, early fibrosis: Secondary | ICD-10-CM | POA: Diagnosis not present

## 2019-10-02 DIAGNOSIS — B182 Chronic viral hepatitis C: Secondary | ICD-10-CM | POA: Diagnosis not present

## 2020-01-01 ENCOUNTER — Encounter: Payer: Self-pay | Admitting: Family Medicine

## 2020-01-01 ENCOUNTER — Other Ambulatory Visit: Payer: Self-pay

## 2020-01-01 ENCOUNTER — Ambulatory Visit: Payer: BC Managed Care – PPO | Admitting: Family Medicine

## 2020-01-01 VITALS — BP 122/76 | HR 92 | Temp 97.4°F | Ht 65.5 in | Wt 142.0 lb

## 2020-01-01 DIAGNOSIS — R319 Hematuria, unspecified: Secondary | ICD-10-CM | POA: Diagnosis not present

## 2020-01-01 DIAGNOSIS — E119 Type 2 diabetes mellitus without complications: Secondary | ICD-10-CM | POA: Diagnosis not present

## 2020-01-01 LAB — POC URINALSYSI DIPSTICK (AUTOMATED)
Bilirubin, UA: NEGATIVE
Glucose, UA: NEGATIVE
Ketones, UA: NEGATIVE
Leukocytes, UA: NEGATIVE
Nitrite, UA: NEGATIVE
Protein, UA: POSITIVE — AB
Spec Grav, UA: 1.015 (ref 1.010–1.025)
Urobilinogen, UA: 0.2 E.U./dL
pH, UA: 6 (ref 5.0–8.0)

## 2020-01-01 MED ORDER — CEPHALEXIN 500 MG PO CAPS: 500.0000 mg | ORAL_CAPSULE | Freq: Two times a day (BID) | ORAL | 0 refills | Status: DC

## 2020-01-01 NOTE — Patient Instructions (Addendum)
Go to the lab on the way out.   If you have mychart we'll likely use that to update you.    I am profoundly sorry for your loss.  Start keflex in case this is a bladder infection.  If you don't get better then let me know.  Take care.  Glad to see you.
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
## 2020-01-02 LAB — CBC WITH DIFFERENTIAL/PLATELET
Basophils Absolute: 0.1 10*3/uL (ref 0.0–0.1)
Basophils Relative: 0.9 % (ref 0.0–3.0)
Eosinophils Absolute: 0.1 10*3/uL (ref 0.0–0.7)
Eosinophils Relative: 1.4 % (ref 0.0–5.0)
HCT: 40.7 % (ref 36.0–46.0)
Hemoglobin: 13.8 g/dL (ref 12.0–15.0)
Lymphocytes Relative: 35.3 % (ref 12.0–46.0)
Lymphs Abs: 2.7 10*3/uL (ref 0.7–4.0)
MCHC: 33.9 g/dL (ref 30.0–36.0)
MCV: 93.9 fl (ref 78.0–100.0)
Monocytes Absolute: 0.7 10*3/uL (ref 0.1–1.0)
Monocytes Relative: 8.8 % (ref 3.0–12.0)
Neutro Abs: 4.1 10*3/uL (ref 1.4–7.7)
Neutrophils Relative %: 53.6 % (ref 43.0–77.0)
Platelets: 148 10*3/uL — ABNORMAL LOW (ref 150.0–400.0)
RBC: 4.33 Mil/uL (ref 3.87–5.11)
RDW: 12.7 % (ref 11.5–15.5)
WBC: 7.7 10*3/uL (ref 4.0–10.5)

## 2020-01-02 LAB — COMPREHENSIVE METABOLIC PANEL
ALT: 12 U/L (ref 0–35)
AST: 23 U/L (ref 0–37)
Albumin: 4.8 g/dL (ref 3.5–5.2)
Alkaline Phosphatase: 52 U/L (ref 39–117)
BUN: 20 mg/dL (ref 6–23)
CO2: 25 mEq/L (ref 19–32)
Calcium: 9.7 mg/dL (ref 8.4–10.5)
Chloride: 105 mEq/L (ref 96–112)
Creatinine, Ser: 0.78 mg/dL (ref 0.40–1.20)
GFR: 73.69 mL/min (ref >60.00)
Glucose, Bld: 120 mg/dL — ABNORMAL HIGH (ref 70–99)
Potassium: 3.9 mEq/L (ref 3.5–5.1)
Sodium: 140 mEq/L (ref 135–145)
Total Bilirubin: 0.4 mg/dL (ref 0.2–1.2)
Total Protein: 7.7 g/dL (ref 6.0–8.3)

## 2020-01-02 LAB — URINE CULTURE
MICRO NUMBER:: 10368012
Result:: NO GROWTH
SPECIMEN QUALITY:: ADEQUATE

## 2020-01-02 LAB — URINALYSIS, MICROSCOPIC ONLY

## 2020-01-02 LAB — HEMOGLOBIN A1C: Hgb A1c MFr Bld: 6.2 % (ref 4.6–6.5)

## 2020-01-03 ENCOUNTER — Other Ambulatory Visit: Payer: Self-pay | Admitting: Family Medicine

## 2020-01-03 DIAGNOSIS — R319 Hematuria, unspecified: Secondary | ICD-10-CM

## 2020-01-03 NOTE — Assessment & Plan Note (Signed)
Unclear source, painless, non-smoker.  We talked about differential diagnosis and she needs follow-up labs done.  See notes on labs and we will go from there.  At this point still okay for outpatient follow-up.

## 2020-01-03 NOTE — Assessment & Plan Note (Signed)
No meds currently.  See notes on labs.  Okay for outpatient follow-up.

## 2020-01-15 ENCOUNTER — Encounter: Payer: Self-pay | Admitting: Urology

## 2020-01-15 ENCOUNTER — Other Ambulatory Visit: Payer: Self-pay

## 2020-01-15 ENCOUNTER — Ambulatory Visit: Payer: BC Managed Care – PPO | Admitting: Urology

## 2020-01-15 VITALS — BP 116/72 | HR 70 | Ht 66.0 in | Wt 140.0 lb

## 2020-01-15 DIAGNOSIS — R310 Gross hematuria: Secondary | ICD-10-CM | POA: Diagnosis not present

## 2020-01-15 DIAGNOSIS — R3121 Asymptomatic microscopic hematuria: Secondary | ICD-10-CM | POA: Diagnosis not present

## 2020-01-15 NOTE — Patient Instructions (Signed)

## 2020-01-15 NOTE — Progress Notes (Signed)
   01/15/20 1:59 PM   [name] 04/21/1951 [id number]  CC: Gross hematuria  HPI: I saw Natalie Rush in urology clinic today for evaluation of gross hematuria.  She is a 69 year old very healthy female with past medical history notable for diabetes and treated hepatitis C who reports a single episode of gross hematuria with bright red urine about 2 weeks ago.  There were no clots and this resolved spontaneously.  She did not have any flank pain, dysuria, or urinary symptoms at that time.  Urinalysis with her PCP showed greater than 50 RBCs, but was otherwise completely benign.  She was given antibiotics by her primary care physician.  She denies any weight loss.  She is a never smoker.  She did previously work in a Veterinary surgeon with dyes.  She denies any family history of bladder or kidney cancer.  There is no prior cross-sectional imaging to review.  She denies any prior abdominal surgeries.   PMH: Past Medical History:  Diagnosis Date  . Bruit    aortic, mitral click w/o regurgitation; (due to habitus)  . Diabetes mellitus without complication (HCC)   . HCV (hepatitis C virus)   . Osteopenia     Surgical History: Past Surgical History:  Procedure Laterality Date  . EXCISIONAL HEMORRHOIDECTOMY  2000  . TUBAL LIGATION      Family History: Family History  Problem Relation Age of Onset  . Diabetes Mother   . Heart disease Mother        CAD  . Alcohol abuse Father   . Hypertension Father   . Hepatitis Father   . Parkinson's disease Sister   . Diabetes Brother   . Colon cancer Neg Hx   . Breast cancer Neg Hx     Social History:  reports that she has never smoked. She has never used smokeless tobacco. She reports previous alcohol use. She reports that she does not use drugs.  Physical Exam: BP 116/72 (BP Location: Left Arm, Patient Position: Sitting, Cuff Size: Normal)   Pulse 70   Ht 5\' 6"  (1.676 m)   Wt 140 lb (63.5 kg)   BMI 22.60 kg/m    Constitutional:  Alert  and oriented, No acute distress. Cardiovascular: No clubbing, cyanosis, or edema. Respiratory: Normal respiratory effort, no increased work of breathing. GI: Abdomen is soft, nontender, nondistended, no abdominal masses GU: No CVA tenderness  Laboratory Data: Reviewed Urinalysis from PCP with greater than 50 RBCs  Pertinent Imaging: None to review  Assessment & Plan:   In summary, she is a healthy 69 year old female with 1 episode of gross hematuria with greater than 50 RBCs seen on urinalysis, and history of dye exposure when working in the [employer].  We discussed common possible etiologies of hematuria including malignancy, urolithiasis, medical renal disease, and idiopathic. Standard workup recommended by the AUA includes imaging with CT urogram to assess the upper tracts, and cystoscopy. Cytology is performed on patient's with gross hematuria to look for malignant cells in the urine.  CT urogram and cystoscopy for gross hematuria work-up  [employer], MD 01/15/2020  [facility] [address] [place], [place] [phone]

## 2020-01-19 LAB — URINALYSIS, COMPLETE
Bilirubin, UA: NEGATIVE
Glucose, UA: NEGATIVE
Ketones, UA: NEGATIVE
Leukocytes,UA: NEGATIVE
Nitrite, UA: NEGATIVE
Protein,UA: NEGATIVE
RBC, UA: NEGATIVE
Specific Gravity, UA: >1.03 — ABNORMAL HIGH (ref 1.005–1.030)
Urobilinogen, Ur: 0.2 mg/dL (ref 0.2–1.0)
pH, UA: 6.5 (ref 5.0–7.5)

## 2020-01-19 LAB — MICROSCOPIC EXAMINATION
Bacteria, UA: NONE SEEN
RBC: NONE SEEN /hpf (ref 0–2)

## 2020-01-21 ENCOUNTER — Other Ambulatory Visit: Payer: Self-pay | Admitting: Family Medicine

## 2020-01-21 DIAGNOSIS — Z1231 Encounter for screening mammogram for malignant neoplasm of breast: Secondary | ICD-10-CM

## 2020-01-29 ENCOUNTER — Other Ambulatory Visit: Payer: Self-pay | Admitting: Urology

## 2020-02-05 ENCOUNTER — Ambulatory Visit (INDEPENDENT_AMBULATORY_CARE_PROVIDER_SITE_OTHER): Payer: BC Managed Care – PPO | Admitting: Urology

## 2020-02-05 ENCOUNTER — Other Ambulatory Visit: Payer: Self-pay

## 2020-02-05 DIAGNOSIS — R319 Hematuria, unspecified: Secondary | ICD-10-CM | POA: Diagnosis not present

## 2020-02-05 DIAGNOSIS — R310 Gross hematuria: Secondary | ICD-10-CM | POA: Diagnosis not present

## 2020-02-05 DIAGNOSIS — R3121 Asymptomatic microscopic hematuria: Secondary | ICD-10-CM

## 2020-02-05 NOTE — Progress Notes (Signed)
Cystoscopy Procedure Note:  Indication: Gross hematuria  After informed consent and discussion of the procedure and its risks, Natalie Rush was positioned and prepped in the standard fashion. Cystoscopy was performed with a flexible cystoscope. The urethra, bladder neck and entire bladder was visualized in a standard fashion. The ureteral orifices were visualized in their normal location and orientation.  Normal bladder mucosa throughout  Imaging: CT urogram is scheduled for June 2 call with results  Findings: Normal cystoscopy  Assessment and Plan: Call with CT urogram results  Legrand Rams, MD 02/05/2020

## 2020-02-06 LAB — URINALYSIS, COMPLETE
Bilirubin, UA: NEGATIVE
Glucose, UA: NEGATIVE
Ketones, UA: NEGATIVE
Nitrite, UA: NEGATIVE
Protein,UA: NEGATIVE
Specific Gravity, UA: 1.025 (ref 1.005–1.030)
Urobilinogen, Ur: 0.2 mg/dL (ref 0.2–1.0)
pH, UA: 6 (ref 5.0–7.5)

## 2020-02-06 LAB — MICROSCOPIC EXAMINATION: Bacteria, UA: NONE SEEN

## 2020-02-12 ENCOUNTER — Other Ambulatory Visit: Payer: Self-pay | Admitting: Urology

## 2020-02-13 ENCOUNTER — Telehealth: Payer: Self-pay | Admitting: Urology

## 2020-02-18 ENCOUNTER — Ambulatory Visit: Payer: BC Managed Care – PPO

## 2020-02-24 ENCOUNTER — Other Ambulatory Visit: Payer: Self-pay

## 2020-02-24 ENCOUNTER — Encounter: Payer: Self-pay | Admitting: Family Medicine

## 2020-02-24 ENCOUNTER — Ambulatory Visit: Payer: BC Managed Care – PPO | Admitting: Family Medicine

## 2020-02-24 ENCOUNTER — Telehealth: Payer: Self-pay | Admitting: Family Medicine

## 2020-02-24 DIAGNOSIS — R42 Dizziness and giddiness: Secondary | ICD-10-CM | POA: Diagnosis not present

## 2020-02-24 MED ORDER — MECLIZINE HCL 25 MG PO TABS: 12.5000 mg | ORAL_TABLET | Freq: Three times a day (TID) | ORAL | 0 refills | Status: DC | PRN

## 2020-02-24 NOTE — Telephone Encounter (Signed)
Patient called today to schedule an appointment to be seen She stated she had to call out of work because she is having a lot of dizziness.  Patient stated that she gets so dizzy that she feels like she may pass out  I sent patient to Access nurse to be triaged.   I received a call back from the patient. She stated they advised her to go to the ED for evaluation since she is having so much dizziness. And that she may need a CT scan done  Patient did not feel like it was serious enough for her to have to go to the ED and asked for appointment here. I scheduled patient @ 2:00 with you but she was advised if the dizziness does get worse or she does pass out she needed to head to the ED.  Also advised patient that she did not need to drive her self to the appointment or if needed the ED. Patient voiced understanding and stated she had someone to drive here to her appointment ,

## 2020-02-24 NOTE — Telephone Encounter (Signed)
[facility] Day - Client TELEPHONE ADVICE RECORD AccessNurse Patient Name: Natalie Rush Gender: Female DOB: [birth year] Age: 69 Y 12 D Return Phone Number: [phone] (Primary) Address[place] [address] Client [facility] Day - Client Client Site  [facility] - Day Contact Type Call Who Is Calling Patient / Member / Family / Caregiver Call Type Triage / Clinical Relationship To Patient Self Return Phone Number [phone] (Primary) Chief Complaint Tongue Swelling Reason for Call Symptomatic / Request for Health Information Initial Comment She is feeling dizzy. She feels like she is about to faint. She said her feels like it is swollen. She had to put ice on it to swallow. GOTO Facility Not Listed [facility] ER Translation No Nurse Assessment Nurse: Annye English, RN, Angelique Blonder Date/Time Lamount Cohen Time): 02/24/2020 10:40:54 AM Confirm and document reason for call. If symptomatic, describe symptoms. ---She is feeling dizzy and faint. She said her tongue felt swollen but this has resolved. Has the patient had close contact with a person known or suspected to have the novel coronavirus illness OR traveled / lives in area with major community spread (including international travel) in the last 14 days from the onset of symptoms? * If Asymptomatic, screen for exposure and travel within the last 14 days. ---No Does the patient have any new or worsening symptoms? ---Yes Will a triage be completed? ---Yes Related visit to physician within the last 2 weeks? ---No Does the PT have any chronic conditions? (i.e. diabetes, asthma, this includes High risk factors for pregnancy, etc.) ---No Is this a behavioral health or substance abuse call? ---No Guidelines Guideline Title Affirmed Question Affirmed Notes Nurse Date/Time (Eastern Time) Dizziness - Vertigo SEVERE dizziness (vertigo) (e.g., unable  to walk without assistance) Carmon, RN, [facility] 02/24/2020 10:42:29 AM Disp. Time Lamount Cohen Time) Disposition Final User 02/24/2020 10:45:33 AM Go to ED Now (or PCP triage) Yes Carmon, RN, DenisePLEASE NOTE: All timestamps contained within this report are represented as [place] Standard Time. CONFIDENTIALTY NOTICE: This fax transmission is intended only for the addressee. It contains information that is legally privileged, confidential or otherwise protected from use or disclosure. If you are not the intended recipient, you are strictly prohibited from reviewing, disclosing, copying using or disseminating any of this information or taking any action in reliance on or regarding this information. If you have received this fax in error, please notify us immediately by telephone so that we can arrange for its return to [place]. Phone: [phone], Toll-Free: [phone], Fax: [phone] Page: 2 of 2 Call Id: [id number] Caller Disagree/Comply Comply Caller Understands Yes PreDisposition Call Doctor Care Advice Given Per Guideline GO TO ED NOW (OR PCP TRIAGE): CARE ADVICE given per Dizziness - Vertigo (Adult) guideline. NOTE TO TRIAGER - DRIVING: * Another adult should drive. NOTE TO TRIAGER - AMBULANCE TRANSPORT: * If the patient cannot walk at all because of severe dizziness, then the patient may need to be transported via ambulance. Referrals GO TO FACILITY OTHER - SPECIFY

## 2020-02-24 NOTE — Patient Instructions (Addendum)
Likely vertigo.  Use the home exercises and meclizine as needed.  Out of work until you feel better.  Update me as needed.  Take care.  Glad to see you.

## 2020-02-24 NOTE — Progress Notes (Signed)
This visit occurred during the SARS-CoV-2 public health emergency.  Safety protocols were in place, including screening questions prior to the visit, additional usage of staff PPE, and extensive cleaning of exam room while observing appropriate contact time as indicated for disinfecting solutions.  She had been weedeating yesterday, working in the yard.  She was fine at the time.  Then last night she felt off balance. Worse with standing. She put up with it last night, felt like she was going to fall when walking to the bathroom last night.  Sx continued last night.  She has some B jaw pain and her tongue felt irritated.  She got back to bed this AM.  The room was spinning this AM.  No syncope.  She feels like both ears are ringing.  No presyncope.    No sx when sitting still in a chair in the exam room this AM.   She had sx of room spinning when she moved from laying to sitting up today in bed.  Better laying down.  Sx are better now than this AM.   No fever home check with thermometer.    Brother with h/o vertigo.     Meds, vitals, and allergies reviewed.   ROS: Per HPI unless specifically indicated in ROS section   nad ncat OP wnl, MMM Neck supple, no LA rrr ctab No sx with moving from supine to sitting when not moving her head but does have sx with supine to sitting while moving head from R to neutral, meaning Dix-Hallpike positive. Not lightheaded on standing while holding head in neutral position.   CN2-12 wnl.   S/S grossly wnl x4 TM wnl B

## 2020-02-24 NOTE — Telephone Encounter (Signed)
Noted. Will see at OV.  Thanks.  

## 2020-02-25 DIAGNOSIS — R42 Dizziness and giddiness: Secondary | ICD-10-CM | POA: Insufficient documentation

## 2020-02-25 NOTE — Assessment & Plan Note (Signed)
Symptoms reproducible with Dix-Hallpike.  She is not orthostatic on standing.  Discussed pathophysiology and options.  Reasonable to use home exercises and also meclizine as needed.  Routine driving cautions given.  She can return to work when her symptoms are resolved.  She agrees with plan.  Okay for outpatient follow-up.

## 2020-04-15 DIAGNOSIS — M79644 Pain in right finger(s): Secondary | ICD-10-CM | POA: Diagnosis not present

## 2020-04-22 DIAGNOSIS — M79644 Pain in right finger(s): Secondary | ICD-10-CM | POA: Diagnosis not present

## 2020-04-23 ENCOUNTER — Other Ambulatory Visit (HOSPITAL_COMMUNITY)
Admission: RE | Admit: 2020-04-23 | Discharge: 2020-04-23 | Disposition: A | Discharge status: Home / Self Care | Payer: Worker's Compensation | Source: Ambulatory Visit | Attending: Orthopedic Surgery | Admitting: Orthopedic Surgery

## 2020-04-23 ENCOUNTER — Other Ambulatory Visit: Payer: Self-pay | Admitting: Orthopedic Surgery

## 2020-04-23 ENCOUNTER — Other Ambulatory Visit: Payer: Self-pay

## 2020-04-23 ENCOUNTER — Encounter (HOSPITAL_BASED_OUTPATIENT_CLINIC_OR_DEPARTMENT_OTHER): Payer: Self-pay | Admitting: Orthopedic Surgery

## 2020-04-23 DIAGNOSIS — Z20822 Contact with and (suspected) exposure to covid-19: Secondary | ICD-10-CM | POA: Diagnosis not present

## 2020-04-23 DIAGNOSIS — Z01812 Encounter for preprocedural laboratory examination: Secondary | ICD-10-CM | POA: Diagnosis not present

## 2020-04-23 LAB — SARS CORONAVIRUS 2 (TAT 6-24 HRS): SARS Coronavirus 2: NEGATIVE

## 2020-04-23 NOTE — H&P (Signed)
Natalie Rush is an 69 y.o. female.   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  She felt a snap and looked down and noticed that her digit was crooked.  She reduced it herself and then utilized a plastic utensil to splint it.  She was seen by Dr. Althea Charon on 04/15/2020 where x-rays were taken and she was found to have a right ring finger proximal phalanx fracture.  It was also noted that she has both malrotation and angulation of the digit when attempting touchdown point.  At this time, this is under her private insurance, but may end up being under [employer]  That has not yet been established.  She is here today to discuss surgical intervention.  Past Medical History:  Diagnosis Date  . Bruit    aortic, mitral click w/o regurgitation; (due to habitus)  . Diabetes mellitus without complication (HCC)   . HCV (hepatitis C virus)   . Osteopenia     Past Surgical History:  Procedure Laterality Date  . EXCISIONAL HEMORRHOIDECTOMY  2000  . TUBAL LIGATION      Family History  Problem Relation Age of Onset  . Diabetes Mother   . Heart disease Mother        CAD  . Alcohol abuse Father   . Hypertension Father   . Hepatitis Father   . Parkinson's disease Sister   . Diabetes Brother   . Colon cancer Neg Hx   . Breast cancer Neg Hx    Social History:  reports that she has never smoked. She has never used smokeless tobacco. She reports previous alcohol use. She reports that she does not use drugs.  Allergies:  Allergies  Allergen Reactions  . Lortab [Hydrocodone-Acetaminophen] Other (See Comments)    Numbness on hands and arms  . Demerol Nausea Only    No medications prior to admission.    No results found for this or any previous visit (from the past 48 hour(s)). No results found.  Review of Systems   All other systems reviewed and are negative.   There were no vitals taken for this visit. Physical Exam  Constitutional:  WD, WN, NAD HEENT:  NCAT, EOMI Neuro/Psych:  Alert & oriented to person, place, and time; appropriate mood & affect Lymphatic: No generalized UE edema or lymphadenopathy Extremities / MSK:  Both UE are normal with respect to appearance, ranges of motion, joint stability, muscle strength/tone, sensation, & perfusion except as otherwise noted:  The right ring finger splint is removed.  As the patient moves her digits into flexion, distal to the PIP the digit is rotated radially and angulated ulnarly with a poor touchdown point. NVI.  Labs / Xrays:  No radiographic studies obtained today.  3 views of the right ring finger ordered and obtained on 04/15/2020 were evaluated and demonstrated a closed, what appears to be extra-articular, oblique fracture of the right ring finger P1.  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  We will plan to take her to surgery to perform open reduction internal fixation of her right ring finger P1.  She is in agreement.  We have taken all of her information to our Workmen's Comp. department, completed a green sheet, and we are waiting on authorization.  Once she  is authorized, we will schedule her surgery and contact her.  The details of the operative procedure were discussed with the patient.  Questions were invited and answered.  In addition to the goal of the procedure, the risks of the procedure to include but not limited to bleeding; infection; damage to the nerves or blood vessels that could result in bleeding, numbness, weakness, chronic pain, and the need for additional procedures; stiffness; the need for revision surgery; and anesthetic risks were reviewed.  No specific outcome was guaranteed or implied.  Informed consent was obtained.   Jodi Marble, MD 04/23/2020,  10:29 AM

## 2020-04-26 ENCOUNTER — Encounter (HOSPITAL_BASED_OUTPATIENT_CLINIC_OR_DEPARTMENT_OTHER)
Admission: RE | Disposition: A | Discharge status: Home / Self Care | Payer: Self-pay | Source: Home / Self Care | Attending: Orthopedic Surgery

## 2020-04-26 ENCOUNTER — Encounter (HOSPITAL_BASED_OUTPATIENT_CLINIC_OR_DEPARTMENT_OTHER): Payer: Self-pay | Admitting: Orthopedic Surgery

## 2020-04-26 ENCOUNTER — Other Ambulatory Visit: Payer: Self-pay

## 2020-04-26 ENCOUNTER — Ambulatory Visit (HOSPITAL_BASED_OUTPATIENT_CLINIC_OR_DEPARTMENT_OTHER): Payer: Worker's Compensation | Admitting: Anesthesiology

## 2020-04-26 ENCOUNTER — Ambulatory Visit (HOSPITAL_COMMUNITY): Payer: Worker's Compensation

## 2020-04-26 ENCOUNTER — Ambulatory Visit (HOSPITAL_BASED_OUTPATIENT_CLINIC_OR_DEPARTMENT_OTHER)
Admission: RE | Admit: 2020-04-26 | Discharge: 2020-04-26 | Disposition: A | Discharge status: Home / Self Care | Payer: Worker's Compensation | Attending: Orthopedic Surgery | Admitting: Orthopedic Surgery

## 2020-04-26 DIAGNOSIS — Y990 Civilian activity done for income or pay: Secondary | ICD-10-CM | POA: Insufficient documentation

## 2020-04-26 DIAGNOSIS — S62614A Displaced fracture of proximal phalanx of right ring finger, initial encounter for closed fracture: Secondary | ICD-10-CM | POA: Diagnosis not present

## 2020-04-26 DIAGNOSIS — Y9375 Activity, martial arts: Secondary | ICD-10-CM | POA: Diagnosis not present

## 2020-04-26 DIAGNOSIS — W230XXA Caught, crushed, jammed, or pinched between moving objects, initial encounter: Secondary | ICD-10-CM | POA: Insufficient documentation

## 2020-04-26 DIAGNOSIS — E119 Type 2 diabetes mellitus without complications: Secondary | ICD-10-CM | POA: Diagnosis not present

## 2020-04-26 DIAGNOSIS — Z419 Encounter for procedure for purposes other than remedying health state, unspecified: Secondary | ICD-10-CM

## 2020-04-26 DIAGNOSIS — S62614D Displaced fracture of proximal phalanx of right ring finger, subsequent encounter for fracture with routine healing: Secondary | ICD-10-CM | POA: Diagnosis not present

## 2020-04-26 HISTORY — PX: OPEN REDUCTION INTERNAL FIXATION (ORIF) PROXIMAL PHALANX: SHX6235

## 2020-04-26 HISTORY — DX: Cardiac murmur, unspecified: R01.1

## 2020-04-26 HISTORY — DX: Dizziness and giddiness: R42

## 2020-04-26 SURGERY — OPEN REDUCTION INTERNAL FIXATION (ORIF) PROXIMAL PHALANX
Anesthesia: Monitor Anesthesia Care | Site: Finger | Laterality: Right

## 2020-04-26 MED ORDER — PROPOFOL 500 MG/50ML IV EMUL
INTRAVENOUS | Status: DC | PRN
  Administered 2020-04-26: 75 ug/kg/min via INTRAVENOUS

## 2020-04-26 MED ORDER — ONDANSETRON HCL 4 MG/2ML IJ SOLN
INTRAMUSCULAR | Status: AC
  Filled 2020-04-26: qty 2

## 2020-04-26 MED ORDER — ONDANSETRON HCL 4 MG/2ML IJ SOLN
INTRAMUSCULAR | Status: DC | PRN
  Administered 2020-04-26: 4 mg via INTRAVENOUS

## 2020-04-26 MED ORDER — FENTANYL CITRATE (PF) 100 MCG/2ML IJ SOLN: 25.0000 ug | INTRAMUSCULAR | Status: DC | PRN

## 2020-04-26 MED ORDER — FENTANYL CITRATE (PF) 100 MCG/2ML IJ SOLN
INTRAMUSCULAR | Status: AC
  Filled 2020-04-26: qty 2

## 2020-04-26 MED ORDER — PROPOFOL 500 MG/50ML IV EMUL
INTRAVENOUS | Status: AC
  Filled 2020-04-26: qty 50

## 2020-04-26 MED ORDER — EPHEDRINE SULFATE 50 MG/ML IJ SOLN
INTRAMUSCULAR | Status: DC | PRN
  Administered 2020-04-26: 10 mg via INTRAVENOUS

## 2020-04-26 MED ORDER — CEFAZOLIN SODIUM-DEXTROSE 2-4 GM/100ML-% IV SOLN
INTRAVENOUS | Status: AC
  Filled 2020-04-26: qty 100

## 2020-04-26 MED ORDER — IBUPROFEN 200 MG PO TABS
600.0000 mg | ORAL_TABLET | Freq: Four times a day (QID) | ORAL | 0 refills | Status: AC
Start: 2020-04-26 — End: 2020-05-11

## 2020-04-26 MED ORDER — MIDAZOLAM HCL 2 MG/2ML IJ SOLN
2.0000 mg | Freq: Once | INTRAMUSCULAR | Status: AC
  Administered 2020-04-26: 1 mg via INTRAVENOUS

## 2020-04-26 MED ORDER — MIDAZOLAM HCL 2 MG/2ML IJ SOLN
INTRAMUSCULAR | Status: AC
  Filled 2020-04-26: qty 2

## 2020-04-26 MED ORDER — LIDOCAINE 2% (20 MG/ML) 5 ML SYRINGE
INTRAMUSCULAR | Status: AC
  Filled 2020-04-26: qty 5

## 2020-04-26 MED ORDER — LIDOCAINE 2% (20 MG/ML) 5 ML SYRINGE
INTRAMUSCULAR | Status: DC | PRN
  Administered 2020-04-26: 40 mg via INTRAVENOUS

## 2020-04-26 MED ORDER — CEFAZOLIN SODIUM-DEXTROSE 2-4 GM/100ML-% IV SOLN
2.0000 g | INTRAVENOUS | Status: AC
  Administered 2020-04-26: 2 g via INTRAVENOUS

## 2020-04-26 MED ORDER — PROPOFOL 10 MG/ML IV BOLUS
INTRAVENOUS | Status: DC | PRN
Start: 2020-04-26 — End: 2020-04-26
  Administered 2020-04-26: 30 mg via INTRAVENOUS

## 2020-04-26 MED ORDER — 0.9 % SODIUM CHLORIDE (POUR BTL) OPTIME
TOPICAL | Status: DC | PRN
  Administered 2020-04-26: 200 mL

## 2020-04-26 MED ORDER — ONDANSETRON HCL 4 MG/2ML IJ SOLN: 4.0000 mg | Freq: Once | INTRAMUSCULAR | Status: DC | PRN

## 2020-04-26 MED ORDER — FENTANYL CITRATE (PF) 100 MCG/2ML IJ SOLN
100.0000 ug | Freq: Once | INTRAMUSCULAR | Status: AC
  Administered 2020-04-26: 50 ug via INTRAVENOUS

## 2020-04-26 MED ORDER — EPHEDRINE 5 MG/ML INJ
INTRAVENOUS | Status: AC
  Filled 2020-04-26: qty 10

## 2020-04-26 MED ORDER — LACTATED RINGERS IV SOLN: INTRAVENOUS | Status: DC

## 2020-04-26 MED ORDER — OXYCODONE HCL 5 MG PO TABS: 5.0000 mg | ORAL_TABLET | Freq: Four times a day (QID) | ORAL | 0 refills | Status: DC | PRN

## 2020-04-26 SURGICAL SUPPLY — 66 items
APL PRP STRL LF DISP 70% ISPRP (MISCELLANEOUS) ×1
BAND INSRT 18 STRL LF DISP RB (MISCELLANEOUS)
BAND RUBBER #18 3X1/16 STRL (MISCELLANEOUS) ×1 IMPLANT
BIT DRILL 1.1 (BIT) ×2
BIT DRILL 60X20X1.1XQC TMX (BIT) IMPLANT
BIT DRL 60X20X1.1XQC TMX (BIT) ×1
BLADE MINI RND TIP GREEN BEAV (BLADE) IMPLANT
BLADE SURG 15 STRL LF DISP TIS (BLADE) ×1 IMPLANT
BLADE SURG 15 STRL SS (BLADE) ×2
BNDG CMPR 9X4 STRL LF SNTH (GAUZE/BANDAGES/DRESSINGS) ×1
BNDG COHESIVE 2X5 TAN STRL LF (GAUZE/BANDAGES/DRESSINGS) IMPLANT
BNDG COHESIVE 4X5 TAN STRL (GAUZE/BANDAGES/DRESSINGS) ×2 IMPLANT
BNDG ESMARK 4X9 LF (GAUZE/BANDAGES/DRESSINGS) ×1 IMPLANT
BNDG GAUZE ELAST 4 BULKY (GAUZE/BANDAGES/DRESSINGS) ×2 IMPLANT
CAP PIN PROTECTOR ORTHO WHT (CAP) IMPLANT
CHLORAPREP W/TINT 26 (MISCELLANEOUS) ×2 IMPLANT
CORD BIPOLAR FORCEPS 12FT (ELECTRODE) ×2 IMPLANT
COVER BACK TABLE 60X90IN (DRAPES) ×2 IMPLANT
COVER MAYO STAND STRL (DRAPES) ×3 IMPLANT
COVER WAND RF STERILE (DRAPES) IMPLANT
CUFF TOURN SGL QUICK 18X4 (TOURNIQUET CUFF) ×1 IMPLANT
DRAPE C-ARM 42X72 X-RAY (DRAPES) ×2 IMPLANT
DRAPE EXTREMITY T 121X128X90 (DISPOSABLE) ×2 IMPLANT
DRAPE SURG 17X23 STRL (DRAPES) IMPLANT
DRIVER BIT 1.5 (TRAUMA) ×2 IMPLANT
DRSG EMULSION OIL 3X3 NADH (GAUZE/BANDAGES/DRESSINGS) ×2 IMPLANT
GAUZE SPONGE 4X4 12PLY STRL LF (GAUZE/BANDAGES/DRESSINGS) ×2 IMPLANT
GLOVE BIO SURGEON STRL SZ7.5 (GLOVE) ×2 IMPLANT
GLOVE BIOGEL PI IND STRL 7.0 (GLOVE) ×1 IMPLANT
GLOVE BIOGEL PI IND STRL 8 (GLOVE) ×1 IMPLANT
GLOVE BIOGEL PI INDICATOR 7.0 (GLOVE) ×3
GLOVE BIOGEL PI INDICATOR 8 (GLOVE) ×1
GLOVE ECLIPSE 6.5 STRL STRAW (GLOVE) ×2 IMPLANT
GLOVE ECLIPSE 7.0 STRL STRAW (GLOVE) ×1 IMPLANT
GOWN STRL REUS W/ TWL LRG LVL3 (GOWN DISPOSABLE) ×2 IMPLANT
GOWN STRL REUS W/TWL LRG LVL3 (GOWN DISPOSABLE) ×4
GOWN STRL REUS W/TWL XL LVL3 (GOWN DISPOSABLE) ×2 IMPLANT
K-WIRE .045X4 (WIRE) IMPLANT
K-WIRE 9  SMOOTH .045 (WIRE) IMPLANT
LOCK SCREW 1.5X9MM (Screw) ×2 IMPLANT
NDL HYPO 25X1 1.5 SAFETY (NEEDLE) IMPLANT
NEEDLE HYPO 25X1 1.5 SAFETY (NEEDLE) IMPLANT
NS IRRIG 1000ML POUR BTL (IV SOLUTION) ×2 IMPLANT
PACK BASIN DAY SURGERY FS (CUSTOM PROCEDURE TRAY) ×2 IMPLANT
PADDING CAST ABS 4INX4YD NS (CAST SUPPLIES) ×1
PADDING CAST ABS COTTON 4X4 ST (CAST SUPPLIES) IMPLANT
PLATE T SMALL 1.5MM (Plate) ×1 IMPLANT
SCREW L 1.5X12 (Screw) ×2 IMPLANT
SCREW LOCK 1.5X9MM (Screw) ×1 IMPLANT
SCREW LOCKING 1.5X10 (Screw) ×4 IMPLANT
SCREW LOCKING 1.5X13MM (Screw) ×2 IMPLANT
SLING ARM FOAM STRAP LRG (SOFTGOODS) ×2 IMPLANT
SPLINT PLASTER CAST XFAST 3X15 (CAST SUPPLIES) IMPLANT
SPLINT PLASTER XTRA FASTSET 3X (CAST SUPPLIES) ×5
STOCKINETTE 6  STRL (DRAPES) ×2
STOCKINETTE 6 STRL (DRAPES) ×1 IMPLANT
SUCTION FRAZIER HANDLE 10FR (MISCELLANEOUS) ×2
SUCTION TUBE FRAZIER 10FR DISP (MISCELLANEOUS) IMPLANT
SUT VICRYL 4-0 PS2 18IN ABS (SUTURE) ×1 IMPLANT
SUT VICRYL RAPIDE 4-0 (SUTURE) ×2 IMPLANT
SUT VICRYL RAPIDE 4/0 PS 2 (SUTURE) IMPLANT
SYR 10ML LL (SYRINGE) IMPLANT
SYR BULB EAR ULCER 3OZ GRN STR (SYRINGE) ×1 IMPLANT
TOWEL GREEN STERILE FF (TOWEL DISPOSABLE) ×2 IMPLANT
TUBE CONNECTING 20X1/4 (TUBING) ×1 IMPLANT
UNDERPAD 30X36 HEAVY ABSORB (UNDERPADS AND DIAPERS) ×2 IMPLANT

## 2020-04-26 NOTE — Anesthesia Procedure Notes (Signed)
Date/Time: 04/26/2020 1:15 PM Performed by: Thornell Mule, CRNA Oxygen Delivery Method: Simple face mask

## 2020-04-26 NOTE — Anesthesia Postprocedure Evaluation (Signed)
Anesthesia Post Note  Patient: Natalie Rush  Procedure(s) Performed: OPEN TREATMENT OF RIGHT RING FINGER FRACTURE (Right Finger)     Patient location during evaluation: PACU Anesthesia Type: Regional Level of consciousness: awake and alert Pain management: pain level controlled Vital Signs Assessment: post-procedure vital signs reviewed and stable Respiratory status: spontaneous breathing, nonlabored ventilation, respiratory function stable and patient connected to nasal cannula oxygen Cardiovascular status: stable and blood pressure returned to baseline Postop Assessment: no apparent nausea or vomiting Anesthetic complications: no   No complications documented.  Last Vitals:  Vitals:   04/26/20 1436 04/26/20 1445  BP:  (!) 105/52  Pulse: 63 62  Resp: 13 14  Temp:  36.9 C  SpO2: 100% 100%    Last Pain:  Vitals:   04/26/20 1445  TempSrc:   PainSc: 0-No pain                 Jerzie Bieri COKER

## 2020-04-26 NOTE — Op Note (Signed)
04/26/2020  1:00 PM  PATIENT:  Natalie Rush  69 y.o. female  PRE-OPERATIVE DIAGNOSIS:  R RF P1 fx with malrotation  POST-OPERATIVE DIAGNOSIS:  Same  PROCEDURE:  ORIF R RF P1 fx  SURGEON: Rayvon Char. Grandville Silos, MD  PHYSICIAN ASSISTANT: Morley Kos, OPA-C  ANESTHESIA:  regional and MAC  SPECIMENS:  None  DRAINS:   None  EBL:  less than 50 mL  PREOPERATIVE INDICATIONS:  Natalie Rush is a  69 y.o. female with a 41+-week old malrotated P1 fx of R RF.   This was denied as a W/C claim, resulting in part for the delay in operative care.  The risks benefits and alternatives were discussed with the patient preoperatively including but not limited to the risks of infection, bleeding, nerve injury, cardiopulmonary complications, the need for revision surgery, among others, and the patient verbalized understanding and consented to proceed.  OPERATIVE IMPLANTS: Bioment 1.73m ALPS plate/screws  OPERATIVE PROCEDURE:  After receiving prophylactic antibiotics and a regional block, the patient was escorted to the operative theatre and placed in a supine position.  A surgical "time-out" was performed during which the planned procedure, proposed operative site, and the correct patient identity were compared to the operative consent and agreement confirmed by the circulating nurse according to current facility policy.  Following application of a tourniquet to the operative extremity, the exposed skin was prepped with Chloraprep and draped in the usual sterile fashion.  The limb was exsanguinated with an Esmarch bandage and the tourniquet inflated to approximately 1081mg higher than systolic BP.  A dorsal linear longitudinal approach was made, coursing slightly transverse at the ends to allow for a very wide radially based flap to be retracted full-thickness.  The extensor apparatus was split midline and reflected.  Periosteum was split on the ulnar side of midline and reflected radially and ulnarly  exposing the fracture.  It was cleaned of debris, and required actually trying it apart to some extent with a freer elevator.  Once the debris from the fracture was cleaned away, the fracture was manually reduced and held with a clamp.  This clinically corrected the rotational malalignment.  A small T plate from the Biomet ALPS hand fracture set was selected, and one of the limbs of the T removed.  This made for a hockey stick shaped plate.  The longitudinal aspect of the plate was applied to the dorsal radial aspect of the proximal phalanx, where there was secured all with 1.5 mm locking screws.  The wing of the plate was sitting on the dorsum of the bone, and a screw was not placed here.  Final images were obtained revealing anatomic alignment and clinical alignment was excellent.  The wound was irrigated and the periosteum reapproximated with 4-0 Vicryl Rapide running suture.  The extensor apparatus was then reapproximated with combination of interrupted and running 4-0 Vicryl sutures.  The tourniquet was released some additional hemostasis obtained and the skin was reapproximated with a combination of interrupted and running 4-0 Vicryl Rapide horizontal mattress sutures.  A bulky hand dressing was applied and she was taken to the recovery room in stable condition.  DISPOSITION: She will be discharged home today, beginning therapy in a few days where a custom protective splint will be fabricated and should begin motion exercises.  RTC with [email or web address] to 15 days with new x-rays of right ring finger out of splint.

## 2020-04-26 NOTE — Interval H&P Note (Signed)
History and Physical Interval Note:  04/26/2020 12:58 PM  Natalie Rush  has presented today for surgery, with the diagnosis of RIGHT RING FINGER FRACTURE.  The various methods of treatment have been discussed with the patient and family. After consideration of risks, benefits and other options for treatment, the patient has consented to  Procedure(s) with comments: OPEN TREATMENT OF RIGHT RING FINGER FRACTURE (Right) - LENGTH OF SURGERY: 90 MIN PRE OP BLOCK as a surgical intervention.  The patient's history has been reviewed, patient examined, no change in status, stable for surgery.  I have reviewed the patient's chart and labs.  Questions were answered to the patient's satisfaction.     Jodi Marble

## 2020-04-26 NOTE — Transfer of Care (Signed)
Immediate Anesthesia Transfer of Care Note  Patient: Natalie Rush  Procedure(s) Performed: OPEN TREATMENT OF RIGHT RING FINGER FRACTURE (Right Finger)  Patient Location: PACU  Anesthesia Type:MAC and Regional  Level of Consciousness: drowsy, patient cooperative and responds to stimulation  Airway & Oxygen Therapy: Patient Spontanous Breathing and Patient connected to face mask oxygen  Post-op Assessment: Report given to RN and Post -op Vital signs reviewed and stable  Post vital signs: Reviewed and stable  Last Vitals:  Vitals Value Taken Time  BP    Temp    Pulse    Resp    SpO2      Last Pain:  Vitals:   04/26/20 1056  TempSrc: Oral         Complications: No complications documented.

## 2020-04-26 NOTE — Discharge Instructions (Addendum)
Discharge Instructions   You have a dressing with a plaster splint incorporated in it. Move your fingers as much as possible, making a full fist and fully opening the fist. Elevate your hand to reduce pain & swelling of the digits.  Ice over the operative site may be helpful to reduce pain & swelling.  DO NOT USE HEAT. Pain medicine has been prescribed for you.  Take Ibuprofen 600 mg every 6 hours. Take the Oxycodone additionally for pain as a rescue medicine for severe post operative pain. Leave the dressing in place until you return to our office.  You may shower, but keep the bandage clean & dry.  You may drive a car when you are off of prescription pain medications and can safely control your vehicle with both hands. Our office will call you to arrange follow-up   Please call [phone] during normal business hours or [phone] after hours for any problems. Including the following:  - excessive redness of the incisions - drainage for more than 4 days - fever of more than 101.5 F  *Please note that pain medications will not be refilled after hours or on weekends.  WORK STATUS: May return to work with these restrictions on Wednesday 04/28/20:  No lifting, gripping or grasping greater than pencil and paper tasks with the right hand.    Post Anesthesia Home Care Instructions  Activity: Get plenty of rest for the remainder of the day. A responsible individual must stay with you for 24 hours following the procedure.  For the next 24 hours, DO NOT: -Drive a car -Advertising copywriter -Drink alcoholic beverages -Take any medication unless instructed by your physician -Make any legal decisions or sign important papers.  Meals: Start with liquid foods such as gelatin or soup. Progress to regular foods as tolerated. Avoid greasy, spicy, heavy foods. If nausea and/or vomiting occur, drink only clear liquids until the nausea and/or vomiting subsides. Call your physician if vomiting  continues.  Special Instructions/Symptoms: Your throat may feel dry or sore from the anesthesia or the breathing tube placed in your throat during surgery. If this causes discomfort, gargle with warm salt water. The discomfort should disappear within 24 hours.  If you had a scopolamine patch placed behind your ear for the management of post- operative nausea and/or vomiting:  1. The medication in the patch is effective for 72 hours, after which it should be removed.  Wrap patch in a tissue and discard in the trash. Wash hands thoroughly with soap and water. 2. You may remove the patch earlier than 72 hours if you experience unpleasant side effects which may include dry mouth, dizziness or visual disturbances. 3. Avoid touching the patch. Wash your hands with soap and water after contact with the patch.  Regional Anesthesia Blocks  1. Numbness or the inability to move the "blocked" extremity may last from 3-48 hours after placement. The length of time depends on the medication injected and your individual response to the medication. If the numbness is not going away after 48 hours, call your surgeon.  2. The extremity that is blocked will need to be protected until the numbness is gone and the  Strength has returned. Because you cannot feel it, you will need to take extra care to avoid injury. Because it may be weak, you may have difficulty moving it or using it. You may not know what position it is in without looking at it while the block is in effect.  3. For blocks  in the legs and feet, returning to weight bearing and walking needs to be done carefully. You will need to wait until the numbness is entirely gone and the strength has returned. You should be able to move your leg and foot normally before you try and bear weight or walk. You will need someone to be with you when you first try to ensure you do not fall and possibly risk injury.  4. Bruising and tenderness at the needle site are common  side effects and will resolve in a few days.  5. Persistent numbness or new problems with movement should be communicated to the surgeon or the [facility] [phone] [facility] [phone]).

## 2020-04-26 NOTE — Anesthesia Procedure Notes (Signed)
Anesthesia Regional Block: Supraclavicular block   Pre-Anesthetic Checklist: ,, timeout performed, Correct Patient, Correct Site, Correct Laterality, Correct Procedure, Correct Position, site marked, Risks and benefits discussed,  Surgical consent,  Pre-op evaluation,  At surgeon's request and post-op pain management  Laterality: Right  Prep: chloraprep       Needles:  Injection technique: Single-shot  Needle Type: Stimulator Needle - 80          Additional Needles:   Procedures:, nerve stimulator,,,,,,,  Narrative:  Start time: 04/26/2020 11:35 AM End time: 04/26/2020 11:45 AM Injection made incrementally with aspirations every 5 mL.  Performed by: Personally   Additional Notes: 20 cc 0.75% Ropivacaine injected easily

## 2020-04-26 NOTE — Progress Notes (Signed)
Assisted Dr. Joslin with right, ultrasound guided, supraclavicular block. Side rails up, monitors on throughout procedure. See vital signs in flow sheet. Tolerated Procedure well. 

## 2020-04-26 NOTE — Anesthesia Preprocedure Evaluation (Signed)
Anesthesia Evaluation  Patient identified by MRN, date of birth, ID band Patient awake    Reviewed: Allergy & Precautions, NPO status , Patient's Chart, lab work & pertinent test results  Airway Mallampati: II  TM Distance: >3 FB Neck ROM: Full    Dental  (+) Teeth Intact, Dental Advisory Given   Pulmonary    breath sounds clear to auscultation       Cardiovascular  Rhythm:Regular Rate:Normal     Neuro/Psych    GI/Hepatic   Endo/Other  diabetes  Renal/GU      Musculoskeletal   Abdominal   Peds  Hematology   Anesthesia Other Findings   Reproductive/Obstetrics                             Anesthesia Physical Anesthesia Plan  ASA: II  Anesthesia Plan: MAC and Regional   Post-op Pain Management:    Induction: Intravenous  PONV Risk Score and Plan: Propofol infusion  Airway Management Planned: Simple Face Mask and Natural Airway  Additional Equipment:   Intra-op Plan:   Post-operative Plan:   Informed Consent: I have reviewed the patients History and Physical, chart, labs and discussed the procedure including the risks, benefits and alternatives for the proposed anesthesia with the patient or authorized representative who has indicated his/her understanding and acceptance.     Dental advisory given  Plan Discussed with: CRNA and Anesthesiologist  Anesthesia Plan Comments:         Anesthesia Quick Evaluation

## 2020-04-29 ENCOUNTER — Encounter (HOSPITAL_BASED_OUTPATIENT_CLINIC_OR_DEPARTMENT_OTHER): Payer: Self-pay | Admitting: Orthopedic Surgery

## 2020-05-06 DIAGNOSIS — S62614D Displaced fracture of proximal phalanx of right ring finger, subsequent encounter for fracture with routine healing: Secondary | ICD-10-CM | POA: Diagnosis not present

## 2020-05-06 DIAGNOSIS — M25641 Stiffness of right hand, not elsewhere classified: Secondary | ICD-10-CM | POA: Diagnosis not present

## 2020-05-06 DIAGNOSIS — S62624D Displaced fracture of medial phalanx of right ring finger, subsequent encounter for fracture with routine healing: Secondary | ICD-10-CM | POA: Diagnosis not present

## 2020-05-10 DIAGNOSIS — S62614D Displaced fracture of proximal phalanx of right ring finger, subsequent encounter for fracture with routine healing: Secondary | ICD-10-CM | POA: Diagnosis not present

## 2020-05-10 DIAGNOSIS — M25641 Stiffness of right hand, not elsewhere classified: Secondary | ICD-10-CM | POA: Diagnosis not present

## 2020-05-19 DIAGNOSIS — S62614D Displaced fracture of proximal phalanx of right ring finger, subsequent encounter for fracture with routine healing: Secondary | ICD-10-CM | POA: Diagnosis not present

## 2020-05-19 DIAGNOSIS — M25641 Stiffness of right hand, not elsewhere classified: Secondary | ICD-10-CM | POA: Diagnosis not present

## 2020-05-28 DIAGNOSIS — M25641 Stiffness of right hand, not elsewhere classified: Secondary | ICD-10-CM | POA: Diagnosis not present

## 2020-05-28 DIAGNOSIS — S62614D Displaced fracture of proximal phalanx of right ring finger, subsequent encounter for fracture with routine healing: Secondary | ICD-10-CM | POA: Diagnosis not present

## 2020-06-03 DIAGNOSIS — M25641 Stiffness of right hand, not elsewhere classified: Secondary | ICD-10-CM | POA: Diagnosis not present

## 2020-06-03 DIAGNOSIS — S62614D Displaced fracture of proximal phalanx of right ring finger, subsequent encounter for fracture with routine healing: Secondary | ICD-10-CM | POA: Diagnosis not present

## 2020-06-08 DIAGNOSIS — S62624D Displaced fracture of medial phalanx of right ring finger, subsequent encounter for fracture with routine healing: Secondary | ICD-10-CM | POA: Diagnosis not present

## 2020-06-10 DIAGNOSIS — S62614D Displaced fracture of proximal phalanx of right ring finger, subsequent encounter for fracture with routine healing: Secondary | ICD-10-CM | POA: Diagnosis not present

## 2020-06-10 DIAGNOSIS — M25641 Stiffness of right hand, not elsewhere classified: Secondary | ICD-10-CM | POA: Diagnosis not present

## 2020-06-15 DIAGNOSIS — S62614D Displaced fracture of proximal phalanx of right ring finger, subsequent encounter for fracture with routine healing: Secondary | ICD-10-CM | POA: Diagnosis not present

## 2020-06-15 DIAGNOSIS — M25641 Stiffness of right hand, not elsewhere classified: Secondary | ICD-10-CM | POA: Diagnosis not present

## 2020-06-23 DIAGNOSIS — S62614D Displaced fracture of proximal phalanx of right ring finger, subsequent encounter for fracture with routine healing: Secondary | ICD-10-CM | POA: Diagnosis not present

## 2020-06-23 DIAGNOSIS — M25641 Stiffness of right hand, not elsewhere classified: Secondary | ICD-10-CM | POA: Diagnosis not present

## 2020-06-29 DIAGNOSIS — M25641 Stiffness of right hand, not elsewhere classified: Secondary | ICD-10-CM | POA: Diagnosis not present

## 2020-06-29 DIAGNOSIS — S62614D Displaced fracture of proximal phalanx of right ring finger, subsequent encounter for fracture with routine healing: Secondary | ICD-10-CM | POA: Diagnosis not present

## 2020-07-08 DIAGNOSIS — M25641 Stiffness of right hand, not elsewhere classified: Secondary | ICD-10-CM | POA: Diagnosis not present

## 2020-07-08 DIAGNOSIS — S62624D Displaced fracture of medial phalanx of right ring finger, subsequent encounter for fracture with routine healing: Secondary | ICD-10-CM | POA: Diagnosis not present

## 2020-07-08 DIAGNOSIS — S62614D Displaced fracture of proximal phalanx of right ring finger, subsequent encounter for fracture with routine healing: Secondary | ICD-10-CM | POA: Diagnosis not present

## 2020-08-17 DIAGNOSIS — S62624D Displaced fracture of medial phalanx of right ring finger, subsequent encounter for fracture with routine healing: Secondary | ICD-10-CM | POA: Diagnosis not present

## 2020-08-31 DIAGNOSIS — H25013 Cortical age-related cataract, bilateral: Secondary | ICD-10-CM | POA: Diagnosis not present

## 2020-08-31 DIAGNOSIS — E119 Type 2 diabetes mellitus without complications: Secondary | ICD-10-CM | POA: Diagnosis not present

## 2020-08-31 LAB — HM DIABETES EYE EXAM

## 2020-10-26 DIAGNOSIS — M79644 Pain in right finger(s): Secondary | ICD-10-CM | POA: Diagnosis not present

## 2020-10-26 DIAGNOSIS — S62624D Displaced fracture of medial phalanx of right ring finger, subsequent encounter for fracture with routine healing: Secondary | ICD-10-CM | POA: Diagnosis not present

## 2020-12-21 DIAGNOSIS — S62624D Displaced fracture of medial phalanx of right ring finger, subsequent encounter for fracture with routine healing: Secondary | ICD-10-CM | POA: Diagnosis not present

## 2021-07-05 ENCOUNTER — Encounter: Payer: Self-pay | Admitting: Family Medicine

## 2021-07-05 ENCOUNTER — Ambulatory Visit (INDEPENDENT_AMBULATORY_CARE_PROVIDER_SITE_OTHER): Payer: BC Managed Care – PPO | Admitting: Family Medicine

## 2021-07-05 ENCOUNTER — Other Ambulatory Visit: Payer: Self-pay

## 2021-07-05 VITALS — BP 118/80 | HR 78 | Temp 98.0°F | Ht 66.0 in | Wt 139.0 lb

## 2021-07-05 DIAGNOSIS — Z7189 Other specified counseling: Secondary | ICD-10-CM

## 2021-07-05 DIAGNOSIS — Z0000 Encounter for general adult medical examination without abnormal findings: Secondary | ICD-10-CM

## 2021-07-05 DIAGNOSIS — Z8619 Personal history of other infectious and parasitic diseases: Secondary | ICD-10-CM | POA: Diagnosis not present

## 2021-07-05 DIAGNOSIS — R319 Hematuria, unspecified: Secondary | ICD-10-CM

## 2021-07-05 DIAGNOSIS — Z87448 Personal history of other diseases of urinary system: Secondary | ICD-10-CM

## 2021-07-05 NOTE — Progress Notes (Signed)
This visit occurred during the SARS-CoV-2 public health emergency.  Safety protocols were in place, including screening questions prior to the visit, additional usage of staff PPE, and extensive cleaning of exam room while observing appropriate contact time as indicated for disinfecting solutions.  CPE- See plan.  Routine anticipatory guidance given to patient.  See health maintenance.  The possibility exists that previously documented standard health maintenance information may have been brought forward from a previous encounter into this note.  If needed, that same information has been updated to reflect the current situation based on today's encounter.    Flu shot encouraged.  Tetanus 2010 Covid d/w pt.   PNA d/w pt.   Shingles d/w pt.   Mammogram d/w pt.  DXA dw pt.   D/w patient HX:TAVWPVX for colon cancer screening, including IFOB vs. colonoscopy.  Risks and benefits of both were discussed and patient voiced understanding.  Pt elects to consider.  Also discussed cologuard.  Living will d/w pt.  Sister Maybelle designated if patient were incapacitated.    She was lost to follow up on hematuria.  No sx or blood in urine now.  No abd pain.  She didn't have prev imaging done per Dr. Richardo Hanks.    Most recent A1c 5.9.  d/w pt.    H/o HCV.  S/p treatment with response and cure.  She was released by hepatitis clinic.  See notes on labs.  No recent use of meclizine, not in months.  No vertigo.  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    She is still doing PT for her R 4th finger after prev fx.  PMH and SH reviewed  Meds, vitals, and allergies reviewed.   ROS: Per HPI.  Unless specifically indicated otherwise in HPI, the patient denies:  General: fever. Eyes: acute vision changes ENT: sore throat Cardiovascular: chest pain Respiratory: SOB GI: vomiting GU: dysuria Musculoskeletal: acute back pain Derm: acute rash Neuro: acute motor dysfunction Psych: worsening  mood Endocrine: polydipsia Heme: bleeding Allergy: hayfever  GEN: nad, alert and oriented HEENT: ncat NECK: supple w/o LA CV: rrr. PULM: ctab, no inc wob ABD: soft, +bs EXT: no edema SKIN: Well-perfused.

## 2021-07-05 NOTE — Patient Instructions (Addendum)
Go to the lab on the way out.   If you have mychart we'll likely use that to update you.    Take care.  Glad to see you.  You can call for a mammogram at [facility] [address] [place] [address] [place]  Let me know if you want me to set up a cologuard test at home.  I would do some form of colon cancer screening.   Vaccines- tetanus, flu, shingles, pneumonia, covid.  You can get them at the pharmacy.    We'll call about getting the CT scan set up.

## 2021-07-06 LAB — COMPREHENSIVE METABOLIC PANEL
ALT: 12 U/L (ref 0–35)
AST: 24 U/L (ref 0–37)
Albumin: 4.8 g/dL (ref 3.5–5.2)
Alkaline Phosphatase: 59 U/L (ref 39–117)
BUN: 18 mg/dL (ref 6–23)
CO2: 26 mEq/L (ref 19–32)
Calcium: 10.1 mg/dL (ref 8.4–10.5)
Chloride: 105 mEq/L (ref 96–112)
Creatinine, Ser: 0.78 mg/dL (ref 0.40–1.20)
GFR: 78.06 mL/min (ref >60.00)
Glucose, Bld: 94 mg/dL (ref 70–99)
Potassium: 4.5 mEq/L (ref 3.5–5.1)
Sodium: 140 mEq/L (ref 135–145)
Total Bilirubin: 0.8 mg/dL (ref 0.2–1.2)
Total Protein: 7.8 g/dL (ref 6.0–8.3)

## 2021-07-08 DIAGNOSIS — Z0000 Encounter for general adult medical examination without abnormal findings: Secondary | ICD-10-CM | POA: Insufficient documentation

## 2021-07-08 NOTE — Assessment & Plan Note (Signed)
Living will d/w pt.  Sister Maybelle designated if patient were incapacitated.

## 2021-07-08 NOTE — Assessment & Plan Note (Signed)
  She was lost to follow up on hematuria.  No sx or blood in urine now.  No abd pain.  She didn't have prev imaging done per Dr. Richardo Hanks.   CT ordered, d/w pt.

## 2021-07-08 NOTE — Assessment & Plan Note (Signed)
Flu shot encouraged.  Tetanus 2010 Covid d/w pt.   PNA d/w pt.   Shingles d/w pt.   Mammogram d/w pt.  DXA dw pt.   D/w patient [id number] for colon cancer screening, including IFOB vs. colonoscopy.  Risks and benefits of both were discussed and patient voiced understanding.  Pt elects to consider.  Also discussed cologuard.  Living will d/w pt.  Sister Maybelle designated if patient were incapacitated.

## 2021-07-25 ENCOUNTER — Ambulatory Visit (INDEPENDENT_AMBULATORY_CARE_PROVIDER_SITE_OTHER)
Admission: RE | Admit: 2021-07-25 | Discharge: 2021-07-25 | Disposition: A | Discharge status: Home / Self Care | Payer: BC Managed Care – PPO | Source: Ambulatory Visit | Attending: Family Medicine | Admitting: Family Medicine

## 2021-07-25 ENCOUNTER — Other Ambulatory Visit: Payer: Self-pay

## 2021-07-25 DIAGNOSIS — Z87448 Personal history of other diseases of urinary system: Secondary | ICD-10-CM

## 2021-07-25 DIAGNOSIS — N200 Calculus of kidney: Secondary | ICD-10-CM | POA: Diagnosis not present

## 2021-07-25 DIAGNOSIS — I700 Atherosclerosis of aorta: Secondary | ICD-10-CM | POA: Diagnosis not present

## 2021-07-25 DIAGNOSIS — R319 Hematuria, unspecified: Secondary | ICD-10-CM | POA: Diagnosis not present

## 2021-07-25 DIAGNOSIS — Q625 Duplication of ureter: Secondary | ICD-10-CM | POA: Diagnosis not present

## 2021-07-25 MED ORDER — IOHEXOL 350 MG/ML SOLN
125.0000 mL | Freq: Once | INTRAVENOUS | Status: AC | PRN
  Administered 2021-07-25: 125 mL via INTRAVENOUS

## 2021-11-08 DIAGNOSIS — H2513 Age-related nuclear cataract, bilateral: Secondary | ICD-10-CM | POA: Diagnosis not present

## 2021-11-08 DIAGNOSIS — E119 Type 2 diabetes mellitus without complications: Secondary | ICD-10-CM | POA: Diagnosis not present

## 2021-11-08 DIAGNOSIS — H25013 Cortical age-related cataract, bilateral: Secondary | ICD-10-CM | POA: Diagnosis not present

## 2021-11-08 DIAGNOSIS — H16143 Punctate keratitis, bilateral: Secondary | ICD-10-CM | POA: Diagnosis not present

## 2021-11-08 LAB — HM DIABETES EYE EXAM

## 2021-12-20 DIAGNOSIS — H16143 Punctate keratitis, bilateral: Secondary | ICD-10-CM | POA: Diagnosis not present

## 2022-03-28 DIAGNOSIS — H16143 Punctate keratitis, bilateral: Secondary | ICD-10-CM | POA: Diagnosis not present

## 2022-06-03 DIAGNOSIS — J111 Influenza due to unidentified influenza virus with other respiratory manifestations: Secondary | ICD-10-CM | POA: Diagnosis not present

## 2022-06-03 DIAGNOSIS — R059 Cough, unspecified: Secondary | ICD-10-CM | POA: Diagnosis not present

## 2022-06-03 DIAGNOSIS — R0989 Other specified symptoms and signs involving the circulatory and respiratory systems: Secondary | ICD-10-CM | POA: Diagnosis not present

## 2022-06-27 ENCOUNTER — Telehealth: Payer: Self-pay | Admitting: Family Medicine

## 2022-06-27 NOTE — Telephone Encounter (Signed)
Patient states she is needing a letter stating that she no longer takes meclizine. She has not had any vertigo in a long while. 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 She can not go back to work without this. I advised patient we have up to 3 days to complete any paperwork but we will try to get this done as soon as we can. Patient schedule an appt for 07/10/22 while she was here.

## 2022-06-27 NOTE — Telephone Encounter (Signed)
Patient called in and was wanting to know if Dr. Damita Dunnings could write a letter stating that she no longer takes the vertigo medication. She would like a call when its done. She isn't able to drive until this is done and submitted. Thank you!

## 2022-06-28 NOTE — Telephone Encounter (Signed)
LM for patient that letter is ready to pick up and placed up front.

## 2022-06-28 NOTE — Telephone Encounter (Signed)
Letter done.  Please make sure printed and sent to patient.  Thanks.

## 2022-07-02 IMAGING — RF DG FINGER RING 2+V*R*
1 series · 3 of 3 positions shown · non-contrast
Comparison: None.

CLINICAL DATA: ORIF right ring finger.

EXAM:
DG C-ARM 1-60 MIN; RIGHT RING FINGER 2+V
FLUOROSCOPY TIME:  Fluoroscopy Time:  9.4 seconds
Radiation Exposure Index (if provided by the fluoroscopic device):
0.14 mGy
Number of Acquired Spot Images: 3

[Series 1: run · 3 of 3 slices shown]
[im 1/3]
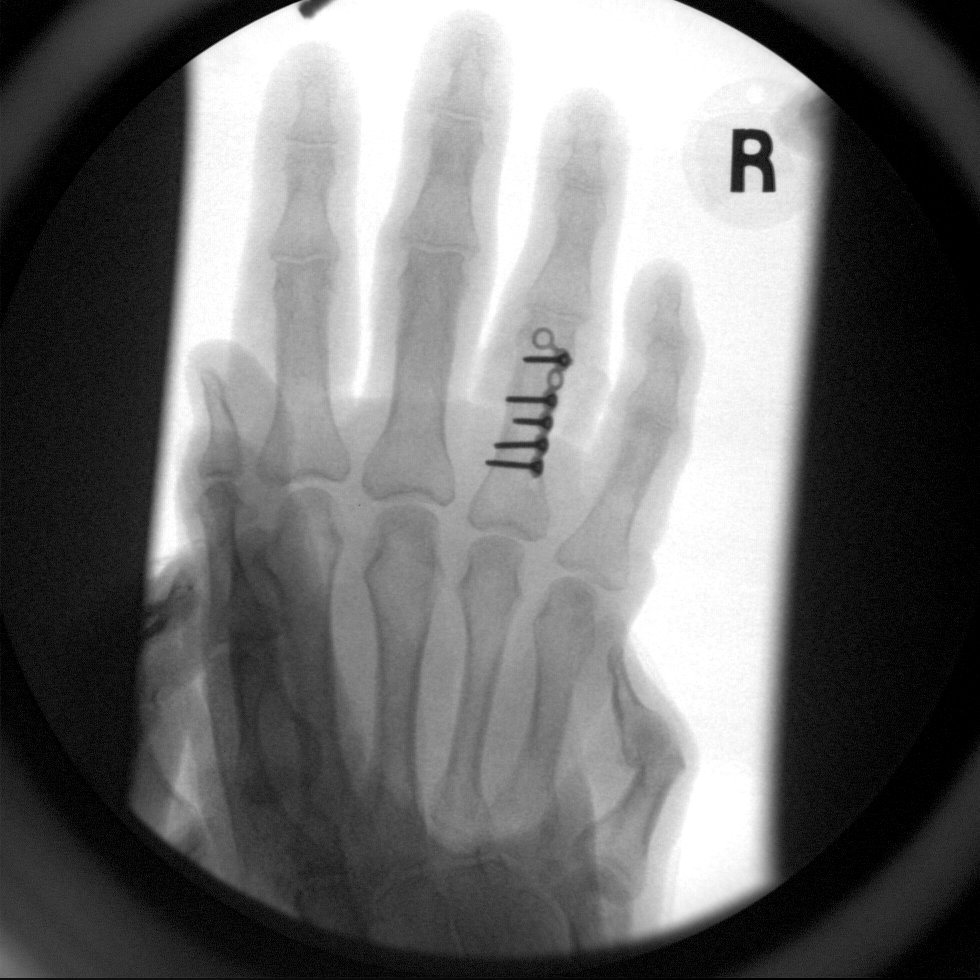
[im 2/3]
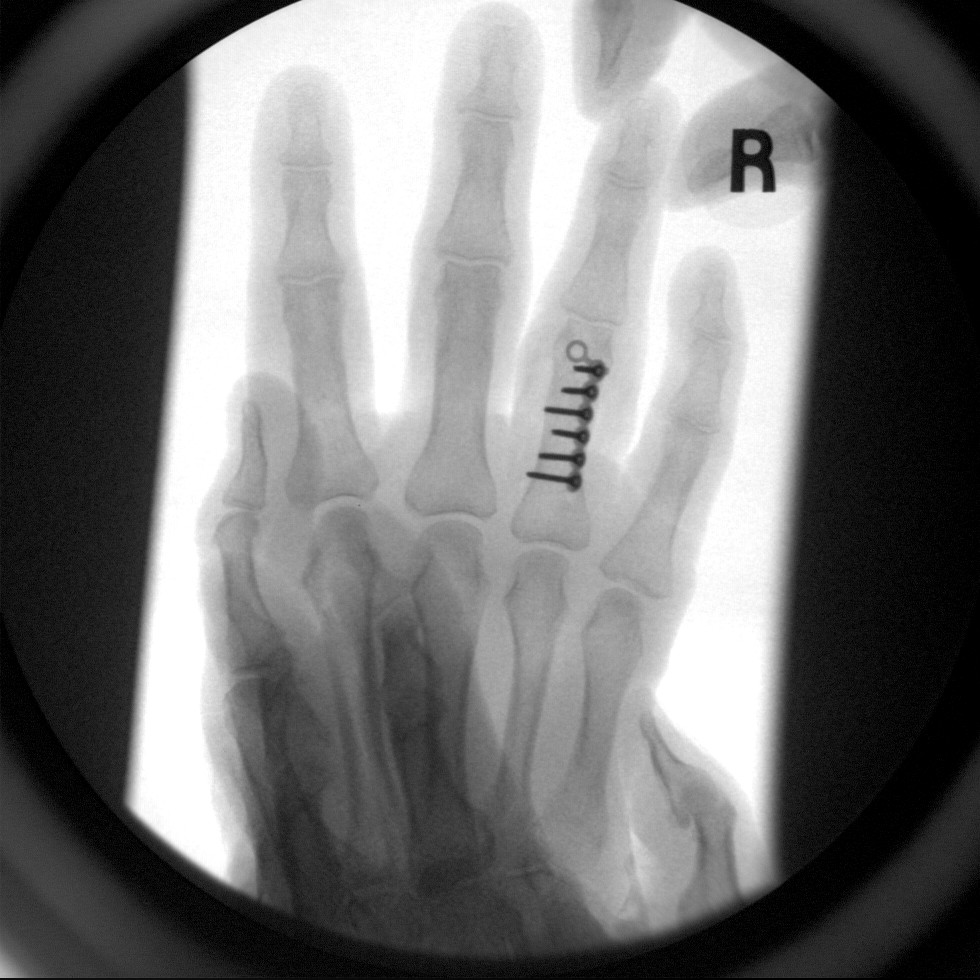
[im 3/3]
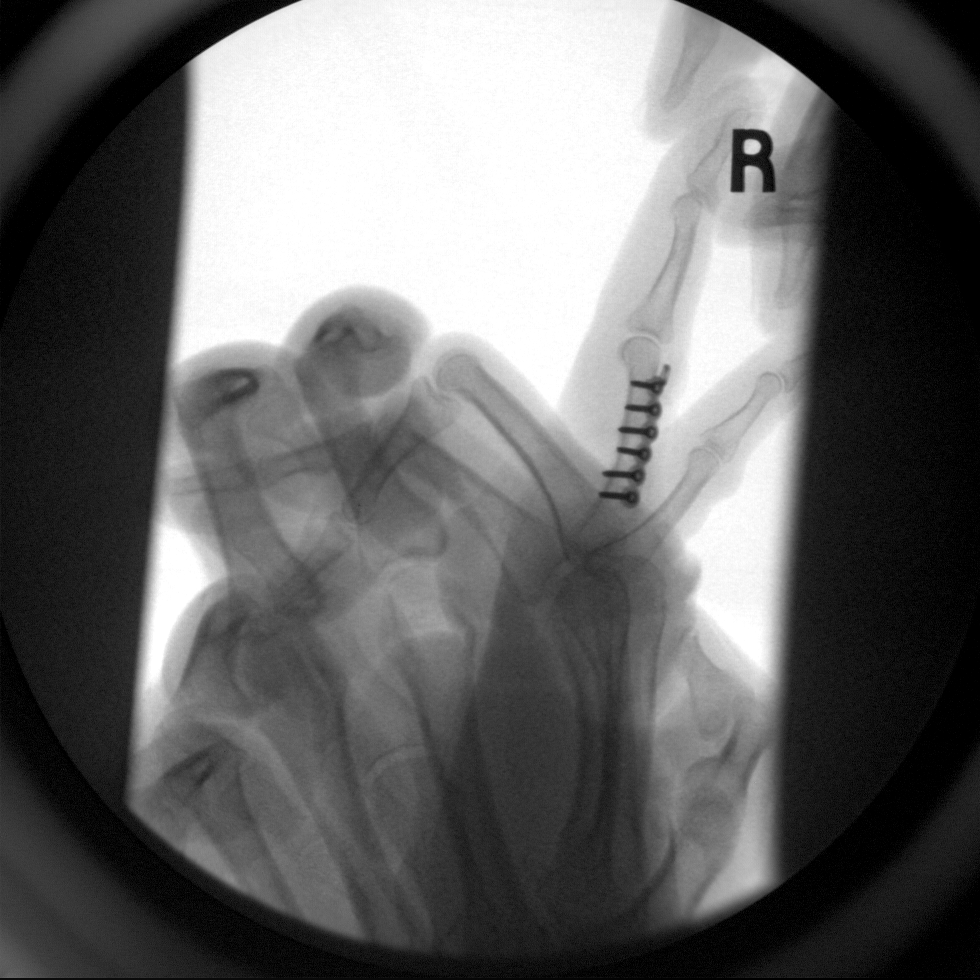

[3 of 3 positions shown; findings below may reference images not displayed]

FINDINGS: Three fluoroscopic spot views of the right ring finger obtained in
the operating room. Plate and screw fixation of the proximal
phalanx.
IMPRESSION: Intraoperative fluoroscopy after ORIF of right ring finger proximal
phalanx.

## 2022-07-10 ENCOUNTER — Ambulatory Visit (INDEPENDENT_AMBULATORY_CARE_PROVIDER_SITE_OTHER): Payer: BC Managed Care – PPO | Admitting: Family Medicine

## 2022-07-10 ENCOUNTER — Encounter: Payer: Self-pay | Admitting: Family Medicine

## 2022-07-10 VITALS — BP 118/82 | HR 64 | Temp 97.5°F | Ht 66.0 in | Wt 141.0 lb

## 2022-07-10 DIAGNOSIS — Z8619 Personal history of other infectious and parasitic diseases: Secondary | ICD-10-CM | POA: Diagnosis not present

## 2022-07-10 DIAGNOSIS — Z7189 Other specified counseling: Secondary | ICD-10-CM

## 2022-07-10 DIAGNOSIS — Z0000 Encounter for general adult medical examination without abnormal findings: Secondary | ICD-10-CM

## 2022-07-10 DIAGNOSIS — R739 Hyperglycemia, unspecified: Secondary | ICD-10-CM

## 2022-07-10 LAB — COMPREHENSIVE METABOLIC PANEL
ALT: 10 U/L (ref 0–35)
AST: 22 U/L (ref 0–37)
Albumin: 4.5 g/dL (ref 3.5–5.2)
Alkaline Phosphatase: 55 U/L (ref 39–117)
BUN: 19 mg/dL (ref 6–23)
CO2: 27 mEq/L (ref 19–32)
Calcium: 9.7 mg/dL (ref 8.4–10.5)
Chloride: 107 mEq/L (ref 96–112)
Creatinine, Ser: 0.75 mg/dL (ref 0.40–1.20)
GFR: 81.24 mL/min (ref >60.00)
Glucose, Bld: 109 mg/dL — ABNORMAL HIGH (ref 70–99)
Potassium: 4.9 mEq/L (ref 3.5–5.1)
Sodium: 141 mEq/L (ref 135–145)
Total Bilirubin: 0.6 mg/dL (ref 0.2–1.2)
Total Protein: 7.3 g/dL (ref 6.0–8.3)

## 2022-07-10 LAB — HEMOGLOBIN A1C: Hgb A1c MFr Bld: 6.5 % (ref 4.6–6.5)

## 2022-07-10 NOTE — Patient Instructions (Signed)
I would the routine vaccines we talked about.  Let me know if you are willing to get a bone density test, mammogram, or colon cancer screening.   Take care.  Glad to see you.

## 2022-07-10 NOTE — Progress Notes (Unsigned)
CPE- See plan.  Routine anticipatory guidance given to patient.  See health maintenance.  The possibility exists that previously documented standard health maintenance information may have been brought forward from a previous encounter into this note.  If needed, that same information has been updated to reflect the current situation based on today's encounter.    Flu shot encouraged. Declined.  Tetanus 2010 Covid d/w pt.  Declined PNA d/w pt.  Declined Shingles d/w pt.  Declined Mammogram d/w pt. Encouraged.  Declined DXA dw pt.  Declined D/w patient SW:[id number] for colon cancer screening, including IFOB vs. colonoscopy.  Risks and benefits of both were discussed and patient voiced understanding.  Pt elects to consider.  Also discussed cologuard. Declined screening at this point.  Living will d/w pt.  Sister Maybelle designated if patient were incapacitated.    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    H/o hyperglycemia.  Recheck labs pending.    H/o HCV, treated.  D/w pt about getting HAV vaccine- declined.  Prev CT abd d/w pt:  Hepatobiliary: No focal liver abnormality is seen. No gallstones, gallbladder wall thickening, or biliary dilatation.  =======================  She was sick 2 months ago, cough and congestion.  Reported testing positive for flu A.  She is better in the meantime.  D/w pt.  Declined flu shot.   PMH and SH reviewed  Meds, vitals, and allergies reviewed.   ROS: Per HPI.  Unless specifically indicated otherwise in HPI, the patient denies:  General: fever. Eyes: acute vision changes ENT: sore throat Cardiovascular: chest pain Respiratory: SOB GI: vomiting GU: dysuria Musculoskeletal: acute back pain Derm: acute rash Neuro: acute motor dysfunction Psych: worsening mood Endocrine: polydipsia Heme: bleeding Allergy: hayfever  GEN: nad, alert and oriented HEENT: ncat NECK: supple w/o LA CV: rrr. PULM: ctab, no inc wob ABD: soft,  +bs EXT: no edema SKIN: no acute rash Chronic changes R 4th PIP, doesn't have full extension.

## 2022-07-12 ENCOUNTER — Encounter: Payer: Self-pay | Admitting: Family Medicine

## 2022-07-12 NOTE — Assessment & Plan Note (Signed)
Sister Maybelle designated if patient were incapacitated.

## 2022-07-12 NOTE — Telephone Encounter (Signed)
Patient called and stated she is waiting on a letter that Dr. Damita Dunnings had to do so she can go to work. Call back number [phone].

## 2022-07-12 NOTE — Assessment & Plan Note (Signed)
Flu shot encouraged. Declined.  Tetanus 2010 Covid d/w pt.  Declined PNA d/w pt.  Declined Shingles d/w pt.  Declined Mammogram d/w pt. Encouraged.  Declined DXA dw pt.  Declined D/w patient [id number]:[id number] for colon cancer screening, including IFOB vs. colonoscopy.  Risks and benefits of both were discussed and patient voiced understanding.  Pt elects to consider.  Also discussed cologuard. Declined screening at this point.  Living will d/w pt.  Sister Maybelle designated if patient were incapacitated.

## 2022-07-13 NOTE — Telephone Encounter (Signed)
Left message for patient that letter is ready to be picked up. Letter placed up front.

## 2022-10-09 DIAGNOSIS — H1712 Central corneal opacity, left eye: Secondary | ICD-10-CM | POA: Diagnosis not present

## 2022-10-09 DIAGNOSIS — H18212 Corneal edema secondary to contact lens, left eye: Secondary | ICD-10-CM | POA: Diagnosis not present

## 2022-10-12 DIAGNOSIS — H18212 Corneal edema secondary to contact lens, left eye: Secondary | ICD-10-CM | POA: Diagnosis not present

## 2022-10-13 ENCOUNTER — Telehealth: Payer: Self-pay | Admitting: Family Medicine

## 2022-10-13 ENCOUNTER — Encounter: Payer: Self-pay | Admitting: Family Medicine

## 2022-10-13 ENCOUNTER — Ambulatory Visit: Payer: BC Managed Care – PPO | Admitting: Family Medicine

## 2022-10-13 VITALS — BP 102/62 | HR 73 | Temp 97.6°F | Ht 66.0 in | Wt 138.0 lb

## 2022-10-13 DIAGNOSIS — Z8639 Personal history of other endocrine, nutritional and metabolic disease: Secondary | ICD-10-CM | POA: Diagnosis not present

## 2022-10-13 DIAGNOSIS — R739 Hyperglycemia, unspecified: Secondary | ICD-10-CM | POA: Diagnosis not present

## 2022-10-13 LAB — POCT GLYCOSYLATED HEMOGLOBIN (HGB A1C): Hemoglobin A1C: 5.6 % (ref 4.0–5.6)

## 2022-10-13 NOTE — Telephone Encounter (Signed)
Need to know the strength and how often she is taking the tobramycin dexamie Tobramycin.  LMTCB

## 2022-10-13 NOTE — Telephone Encounter (Signed)
Patient called and stated she was told to call back and state what medication she is taken which is tobramycin dexamie.

## 2022-10-13 NOTE — Patient Instructions (Addendum)
Thanks for your effort.  Keep going with the lower sugar diet.  Update me as needed.  Not diabetic at this point.  Take care.  Glad to see you. Yearly visit at/near the end of 2024.

## 2022-10-13 NOTE — Assessment & Plan Note (Signed)
Not diabetic by current A1c, d/w pt about diet.   A1c d/w pt at OV.  A1c 5.6.  She cut back on carbs, used low carb diet.  Continue as is and recheck periodically.  She agrees to plan.

## 2022-10-13 NOTE — Progress Notes (Unsigned)
H/o DM2. No meds. Hypoglycemic episodes: no sx Hyperglycemic episodes: no sx Feet problems: no Blood Sugars averaging: not checked.  A1c d/w pt at OV.  A1c 5.6.  She cut back on carbs, used low carb diet.   Sister with h/o parkinsons.  Patient's nephew recently died, funeral was last night. Condolences offered.    She has seen the eye clinic in the meantime.  Vision improved in the meantime, back to baseline.  She is on unrecalled eyedrop temporarily.  D/w pt.  She has f/u pending.    Meds, vitals, and allergies reviewed.  ROS: Per HPI unless specifically indicated in ROS section   GEN: nad, alert and oriented HEENT: ncat NECK: supple w/o LA CV: rrr. PULM: ctab, no inc wob ABD: soft, +bs EXT: no edema SKIN: no acute rash

## 2022-10-18 NOTE — Telephone Encounter (Signed)
Noted. Thanks.

## 2022-10-18 NOTE — Telephone Encounter (Signed)
Patient called in to report that: Tobramycin 0.1 mg Dexamie Tobramycin 0.3 mg

## 2022-10-18 NOTE — Telephone Encounter (Signed)
Called patient she does not remember strength but she is taking 4 times a day. She will call us tomorrow when she gets home to tell us.

## 2022-10-20 DIAGNOSIS — H1789 Other corneal scars and opacities: Secondary | ICD-10-CM | POA: Diagnosis not present

## 2022-10-20 DIAGNOSIS — H16142 Punctate keratitis, left eye: Secondary | ICD-10-CM | POA: Diagnosis not present

## 2022-10-20 DIAGNOSIS — H18212 Corneal edema secondary to contact lens, left eye: Secondary | ICD-10-CM | POA: Diagnosis not present

## 2022-11-21 DIAGNOSIS — H16041 Marginal corneal ulcer, right eye: Secondary | ICD-10-CM | POA: Diagnosis not present

## 2022-11-21 DIAGNOSIS — H18231 Secondary corneal edema, right eye: Secondary | ICD-10-CM | POA: Diagnosis not present

## 2022-11-27 DIAGNOSIS — H16041 Marginal corneal ulcer, right eye: Secondary | ICD-10-CM | POA: Diagnosis not present

## 2022-11-28 DIAGNOSIS — H18231 Secondary corneal edema, right eye: Secondary | ICD-10-CM | POA: Diagnosis not present

## 2022-11-28 DIAGNOSIS — H16041 Marginal corneal ulcer, right eye: Secondary | ICD-10-CM | POA: Diagnosis not present

## 2022-11-29 DIAGNOSIS — H16042 Marginal corneal ulcer, left eye: Secondary | ICD-10-CM | POA: Diagnosis not present

## 2022-11-29 DIAGNOSIS — H18231 Secondary corneal edema, right eye: Secondary | ICD-10-CM | POA: Diagnosis not present

## 2022-11-29 DIAGNOSIS — H16041 Marginal corneal ulcer, right eye: Secondary | ICD-10-CM | POA: Diagnosis not present

## 2022-11-29 DIAGNOSIS — H16003 Unspecified corneal ulcer, bilateral: Secondary | ICD-10-CM | POA: Diagnosis not present

## 2022-12-04 DIAGNOSIS — H16003 Unspecified corneal ulcer, bilateral: Secondary | ICD-10-CM | POA: Diagnosis not present

## 2022-12-08 DIAGNOSIS — H16003 Unspecified corneal ulcer, bilateral: Secondary | ICD-10-CM | POA: Diagnosis not present

## 2023-01-16 DIAGNOSIS — H16041 Marginal corneal ulcer, right eye: Secondary | ICD-10-CM | POA: Diagnosis not present

## 2023-01-16 DIAGNOSIS — E119 Type 2 diabetes mellitus without complications: Secondary | ICD-10-CM | POA: Diagnosis not present

## 2023-01-16 DIAGNOSIS — H2513 Age-related nuclear cataract, bilateral: Secondary | ICD-10-CM | POA: Diagnosis not present

## 2023-01-16 DIAGNOSIS — H1789 Other corneal scars and opacities: Secondary | ICD-10-CM | POA: Diagnosis not present

## 2023-01-16 LAB — HM DIABETES EYE EXAM

## 2023-02-15 DIAGNOSIS — H1789 Other corneal scars and opacities: Secondary | ICD-10-CM | POA: Diagnosis not present

## 2023-02-15 DIAGNOSIS — H16223 Keratoconjunctivitis sicca, not specified as Sjogren's, bilateral: Secondary | ICD-10-CM | POA: Diagnosis not present

## 2023-02-15 DIAGNOSIS — H16143 Punctate keratitis, bilateral: Secondary | ICD-10-CM | POA: Diagnosis not present

## 2023-03-12 DIAGNOSIS — H16143 Punctate keratitis, bilateral: Secondary | ICD-10-CM | POA: Diagnosis not present

## 2023-03-12 DIAGNOSIS — H1789 Other corneal scars and opacities: Secondary | ICD-10-CM | POA: Diagnosis not present

## 2023-03-12 DIAGNOSIS — H16223 Keratoconjunctivitis sicca, not specified as Sjogren's, bilateral: Secondary | ICD-10-CM | POA: Diagnosis not present

## 2023-05-24 ENCOUNTER — Telehealth: Payer: Self-pay | Admitting: Family Medicine

## 2023-05-24 NOTE — Telephone Encounter (Signed)
Called patient left message to call office will need office visit for evaluation.

## 2023-05-24 NOTE — Telephone Encounter (Signed)
Pt called stating she has been having issues with both knees. Pt states her left knee is swollen & hurts to move. Pt states her right knee has a burning feeling that is also in her calf muscle. Pt requested an referral. Call back # [phone]

## 2023-05-24 NOTE — Telephone Encounter (Signed)
Patient returned call.I have her scheduled

## 2023-05-28 ENCOUNTER — Encounter: Payer: Self-pay | Admitting: Family Medicine

## 2023-05-28 ENCOUNTER — Ambulatory Visit (INDEPENDENT_AMBULATORY_CARE_PROVIDER_SITE_OTHER)
Admission: RE | Admit: 2023-05-28 | Discharge: 2023-05-28 | Disposition: A | Discharge status: Home / Self Care | Payer: BC Managed Care – PPO | Source: Ambulatory Visit | Attending: Family Medicine | Admitting: Family Medicine

## 2023-05-28 ENCOUNTER — Ambulatory Visit: Payer: BC Managed Care – PPO | Admitting: Family Medicine

## 2023-05-28 VITALS — BP 104/62 | HR 75 | Temp 98.1°F | Ht 66.0 in | Wt 140.0 lb

## 2023-05-28 DIAGNOSIS — M25562 Pain in left knee: Secondary | ICD-10-CM

## 2023-05-28 DIAGNOSIS — M25569 Pain in unspecified knee: Secondary | ICD-10-CM

## 2023-05-28 DIAGNOSIS — M25462 Effusion, left knee: Secondary | ICD-10-CM | POA: Diagnosis not present

## 2023-05-28 MED ORDER — PREDNISONE 10 MG PO TABS: ORAL_TABLET | ORAL | 0 refills | Status: DC

## 2023-05-28 NOTE — Patient Instructions (Signed)
Prednisone with food.  Go to the lab on the way out.   If you have mychart we'll likely use that to update you.    Take care.  Glad to see you. Ice as needed for 5 minutes at a time.

## 2023-05-28 NOTE — Progress Notes (Unsigned)
B knee pain for about 2 weeks.  No trigger.  No trauma.  Had L knee swelling.  Had B knee pain.  L knee swelling is better.  No FCNAVD.  Didn't feel a pop or snap.  Tried OTC aspirin, motrin.  Pain on L knee ROM.    Meds, vitals, and allergies reviewed.   ROS: Per HPI unless specifically indicated in ROS section   Medial L knee ttp, mildly puffy.  Crepitus on ROM.   L ankle not puffy.     Less R knee crepitus.  Not puffy.

## 2023-05-30 DIAGNOSIS — M25569 Pain in unspecified knee: Secondary | ICD-10-CM | POA: Insufficient documentation

## 2023-05-30 NOTE — Assessment & Plan Note (Signed)
Possible/likely OA flare.  D/w pt about options . Prednisone with food. Steroid cautions d/w pt.   Xray today Ice as needed for 5 minutes at a time.  Update me as needed.

## 2023-05-31 ENCOUNTER — Ambulatory Visit: Payer: BC Managed Care – PPO | Admitting: Family Medicine

## 2023-06-12 ENCOUNTER — Telehealth: Payer: Self-pay | Admitting: Family Medicine

## 2023-06-12 NOTE — Telephone Encounter (Signed)
See result note.  

## 2023-06-12 NOTE — Telephone Encounter (Signed)
Pt called returning Natalie Rush's call regarding Xray results. Told pt [name]'s response. Pt states the pain was gone until last Fri, 9/20, then it came back. Call back # [phone]

## 2023-06-13 ENCOUNTER — Other Ambulatory Visit: Payer: Self-pay | Admitting: Family Medicine

## 2023-06-13 DIAGNOSIS — M25569 Pain in unspecified knee: Secondary | ICD-10-CM

## 2023-06-13 MED ORDER — PREDNISONE 10 MG PO TABS: ORAL_TABLET | ORAL | 0 refills | Status: DC

## 2023-07-05 ENCOUNTER — Other Ambulatory Visit (INDEPENDENT_AMBULATORY_CARE_PROVIDER_SITE_OTHER): Payer: BC Managed Care – PPO

## 2023-07-05 ENCOUNTER — Ambulatory Visit: Payer: BC Managed Care – PPO | Admitting: Orthopaedic Surgery

## 2023-07-05 DIAGNOSIS — M25561 Pain in right knee: Secondary | ICD-10-CM

## 2023-07-05 DIAGNOSIS — M25562 Pain in left knee: Secondary | ICD-10-CM

## 2023-07-05 DIAGNOSIS — G8929 Other chronic pain: Secondary | ICD-10-CM | POA: Diagnosis not present

## 2023-07-05 NOTE — Progress Notes (Signed)
Office Visit Note   Patient: Natalie Rush           Date of Birth: 1950/09/25           MRN: [id number] Visit Date: 07/05/2023              Requested by: Joaquim Nam, MD [address] [place],  [place] [address] PCP: Joaquim Nam, MD   Assessment & Plan: Visit Diagnoses:  1. 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  We will provide her with counterforce straps and I have recommended Voltaren gel.  Relative rest and activity modification.  Follow-up as needed.  Follow-Up Instructions: No follow-ups on file.   Orders:  Orders Placed This Encounter  Procedures   XR KNEE 3 VIEW RIGHT   No orders of the defined types were placed in this encounter.     Procedures: No procedures performed   Clinical Data: No additional findings.   Subjective: Chief Complaint  Patient presents with   Right Knee - Pain   Left Knee - Pain    HPI [name] is a 72 year old female here for evaluation of bilateral knee pain.  She reports pain towards the inferior portion of the patella and the tibial tubercle.  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  Denies any injuries.  Denies any mechanical symptoms or swelling.  Symptoms are worse with activity. Review of Systems  Constitutional: Negative.   HENT: Negative.    Eyes: Negative.   Respiratory: Negative.    Cardiovascular: Negative.   Endocrine: Negative.   Musculoskeletal: Negative.   Neurological: Negative.   Hematological: Negative.   Psychiatric/Behavioral: Negative.    All other systems reviewed and are negative.    Objective: Vital Signs: There were no vitals taken for this visit.  Physical Exam Vitals and nursing note reviewed.  Constitutional:      Appearance: She is well-developed.  HENT:     Head: Atraumatic.     Nose: Nose normal.   Eyes:     Extraocular Movements: Extraocular movements intact.  Cardiovascular:     Pulses: Normal pulses.  Pulmonary:     Effort: Pulmonary effort is normal.  Abdominal:     Palpations: Abdomen is soft.  Musculoskeletal:     Cervical back: Neck supple.  Skin:    General: Skin is warm.     Capillary Refill: Capillary refill takes less than 2 seconds.  Neurological:     Mental Status: She is alert. Mental status is at baseline.  Psychiatric:        Behavior: Behavior normal.        Thought Content: Thought content normal.        Judgment: Judgment normal.     Ortho Exam Examination of bilateral knees showed no joint effusion.  Collaterals and cruciates are stable.  No swelling.  No joint line tenderness.  Normal range of motion and strength.  She reports pain around the tibial tubercle. Specialty Comments:  No specialty comments available.  Imaging: No results found.   PMFS History: Patient Active Problem List   Diagnosis Date Noted   Knee pain 05/30/2023   Routine medical exam 07/08/2021   Vertigo 02/25/2020   Hematuria 01/03/2020   History of diabetes mellitus 06/09/2018   Health care maintenance 05/31/2018   Advance  care planning 05/31/2018   Temporomandibular joint (TMJ) pain 05/31/2018   Dyspareunia in female 05/31/2018   HCV (hepatitis C virus) 05/31/2018   PAIN IN JOINT, MULTIPLE SITES 03/15/2009   Disorder of bone and cartilage 03/15/2009   Past Medical History:  Diagnosis Date   Bruit    aortic, mitral click w/o regurgitation; (due to habitus)   Diabetes mellitus without complication (HCC)    diet controlled - NO meds   HCV (hepatitis C virus)    2019   Heart murmur    Osteopenia    Vertigo     Family History  Problem Relation Age of Onset   Diabetes Mother    Heart disease Mother        CAD   Alcohol abuse Father    Hypertension Father    Hepatitis Father    Parkinson's disease Sister    Diabetes Brother    Colon cancer Neg Hx    Breast  cancer Neg Hx     Past Surgical History:  Procedure Laterality Date   EXCISIONAL HEMORRHOIDECTOMY  2000   OPEN REDUCTION INTERNAL FIXATION (ORIF) PROXIMAL PHALANX Right 04/26/2020   Procedure: OPEN TREATMENT OF RIGHT RING FINGER FRACTURE;  Surgeon: Mack Hook, MD;  Location: [facility];  Service: Orthopedics;  Laterality: Right;  LENGTH OF SURGERY: 90 MIN PRE OP BLOCK   TUBAL LIGATION     Social History   Occupational History   Occupation: truck Air traffic controller: [employer]  Tobacco Use   Smoking status: Never   Smokeless tobacco: Never  Substance and Sexual Activity   Alcohol use: Not Currently   Drug use: Never   Sexual activity: Yes    Birth control/protection: Post-menopausal

## 2023-07-13 ENCOUNTER — Ambulatory Visit (INDEPENDENT_AMBULATORY_CARE_PROVIDER_SITE_OTHER): Payer: BC Managed Care – PPO | Admitting: Family Medicine

## 2023-07-13 ENCOUNTER — Encounter: Payer: Self-pay | Admitting: Family Medicine

## 2023-07-13 VITALS — BP 114/66 | HR 85 | Temp 97.8°F | Ht 64.25 in | Wt 141.4 lb

## 2023-07-13 DIAGNOSIS — R739 Hyperglycemia, unspecified: Secondary | ICD-10-CM

## 2023-07-13 DIAGNOSIS — Z0000 Encounter for general adult medical examination without abnormal findings: Secondary | ICD-10-CM | POA: Diagnosis not present

## 2023-07-13 DIAGNOSIS — Z1321 Encounter for screening for nutritional disorder: Secondary | ICD-10-CM | POA: Diagnosis not present

## 2023-07-13 DIAGNOSIS — Z13220 Encounter for screening for lipoid disorders: Secondary | ICD-10-CM | POA: Diagnosis not present

## 2023-07-13 DIAGNOSIS — Z7189 Other specified counseling: Secondary | ICD-10-CM

## 2023-07-13 DIAGNOSIS — Z1211 Encounter for screening for malignant neoplasm of colon: Secondary | ICD-10-CM

## 2023-07-13 LAB — LIPID PANEL
Cholesterol: 219 mg/dL — ABNORMAL HIGH (ref 0–200)
HDL: 66 mg/dL (ref >39.00)
LDL Cholesterol: 126 mg/dL — ABNORMAL HIGH (ref 0–99)
NonHDL: 152.75
Total CHOL/HDL Ratio: 3
Triglycerides: 133 mg/dL (ref 0.0–149.0)
VLDL: 26.6 mg/dL (ref 0.0–40.0)

## 2023-07-13 LAB — COMPREHENSIVE METABOLIC PANEL
ALT: 13 U/L (ref 0–35)
AST: 24 U/L (ref 0–37)
Albumin: 4.6 g/dL (ref 3.5–5.2)
Alkaline Phosphatase: 66 U/L (ref 39–117)
BUN: 19 mg/dL (ref 6–23)
CO2: 30 meq/L (ref 19–32)
Calcium: 10.3 mg/dL (ref 8.4–10.5)
Chloride: 101 meq/L (ref 96–112)
Creatinine, Ser: 0.85 mg/dL (ref 0.40–1.20)
GFR: 69.42 mL/min (ref >60.00)
Glucose, Bld: 113 mg/dL — ABNORMAL HIGH (ref 70–99)
Potassium: 4.2 meq/L (ref 3.5–5.1)
Sodium: 141 meq/L (ref 135–145)
Total Bilirubin: 0.6 mg/dL (ref 0.2–1.2)
Total Protein: 7.5 g/dL (ref 6.0–8.3)

## 2023-07-13 LAB — HEMOGLOBIN A1C: Hgb A1c MFr Bld: 6.7 % — ABNORMAL HIGH (ref 4.6–6.5)

## 2023-07-13 LAB — VITAMIN D 25 HYDROXY (VIT D DEFICIENCY, FRACTURES): VITD: 23.55 ng/mL — ABNORMAL LOW (ref 30.00–100.00)

## 2023-07-13 NOTE — Progress Notes (Unsigned)
CPE- See plan.  Routine anticipatory guidance given to patient.  See health maintenance.  The possibility exists that previously documented standard health maintenance information may have been brought forward from a previous encounter into this note.  If needed, that same information has been updated to reflect the current situation based on today's encounter.    Flu shot encouraged. Declined.  Tetanus 2010, dw pt.  Covid d/w pt.  Declined PNA d/w pt.  Declined Shingles d/w pt.  Declined Mammogram d/w pt. Encouraged.  Declined DXA dw pt.  Declined D/w patient [id number] for colon cancer screening, including IFOB vs. colonoscopy.  Risks and benefits of both were discussed and patient voiced understanding. Also discussed cologuard. Opts for cologuard.  Living will d/w pt.  Sister Maybelle designated if patient were incapacitated.    See notes on labs.  See AVS.    PMH and SH reviewed  Meds, vitals, and allergies reviewed.   ROS: Per HPI.  Unless specifically indicated otherwise in HPI, the patient denies:  General: fever. Eyes: acute vision changes ENT: sore throat Cardiovascular: chest pain Respiratory: SOB GI: vomiting GU: dysuria Musculoskeletal: acute back pain Derm: acute rash Neuro: acute motor dysfunction Psych: worsening mood Endocrine: polydipsia Heme: bleeding Allergy: hayfever  GEN: nad, alert and oriented HEENT: ncat NECK: supple w/o LA CV: rrr. PULM: ctab, no inc wob ABD: soft, +bs EXT: no edema SKIN: no acute rash

## 2023-07-13 NOTE — Patient Instructions (Addendum)
Take care.  Glad to see you. Go to the lab on the way out.   If you have mychart we'll likely use that to update you.     Let me know if you don't get a cologuard kit.   You can call for a mammogram at the [facility] [address] [place] [address] [place]  Tetanus shot may be cheaper at the pharmacy.  I would get a flu shot each fall.

## 2023-07-15 DIAGNOSIS — Z0000 Encounter for general adult medical examination without abnormal findings: Secondary | ICD-10-CM | POA: Insufficient documentation

## 2023-07-15 NOTE — Assessment & Plan Note (Signed)
Flu shot encouraged. Declined.  Tetanus 2010, dw pt.  Covid d/w pt.  Declined PNA d/w pt.  Declined Shingles d/w pt.  Declined Mammogram d/w pt. Encouraged.  Declined DXA dw pt.  Declined D/w patient [id number] for colon cancer screening, including IFOB vs. colonoscopy.  Risks and benefits of both were discussed and patient voiced understanding. Also discussed cologuard. Opts for cologuard.  Living will d/w pt.  Sister Maybelle designated if patient were incapacitated.

## 2023-07-15 NOTE — Assessment & Plan Note (Signed)
Living will d/w pt.  Sister Maybelle designated if patient were incapacitated.   

## 2023-07-16 ENCOUNTER — Other Ambulatory Visit: Payer: Self-pay | Admitting: Family Medicine

## 2023-07-16 ENCOUNTER — Encounter: Payer: Self-pay | Admitting: Family Medicine

## 2023-07-16 DIAGNOSIS — E559 Vitamin D deficiency, unspecified: Secondary | ICD-10-CM

## 2023-07-16 DIAGNOSIS — E119 Type 2 diabetes mellitus without complications: Secondary | ICD-10-CM

## 2023-07-16 MED ORDER — VITAMIN D3 50 MCG (2000 UT) PO CAPS: 2000.0000 [IU] | ORAL_CAPSULE | Freq: Every day | ORAL | Status: AC

## 2023-08-03 DIAGNOSIS — Z1211 Encounter for screening for malignant neoplasm of colon: Secondary | ICD-10-CM | POA: Diagnosis not present

## 2023-08-11 LAB — COLOGUARD: COLOGUARD: NEGATIVE

## 2023-11-12 ENCOUNTER — Encounter: Payer: Self-pay | Admitting: Family Medicine

## 2023-11-12 ENCOUNTER — Ambulatory Visit: Payer: BC Managed Care – PPO | Admitting: Family Medicine

## 2023-11-12 VITALS — BP 132/84 | HR 78 | Temp 97.9°F | Wt 144.2 lb

## 2023-11-12 DIAGNOSIS — E119 Type 2 diabetes mellitus without complications: Secondary | ICD-10-CM | POA: Diagnosis not present

## 2023-11-12 DIAGNOSIS — E559 Vitamin D deficiency, unspecified: Secondary | ICD-10-CM | POA: Diagnosis not present

## 2023-11-12 LAB — VITAMIN D 25 HYDROXY (VIT D DEFICIENCY, FRACTURES): VITD: 35.89 ng/mL (ref 30.00–100.00)

## 2023-11-12 LAB — MICROALBUMIN / CREATININE URINE RATIO
Creatinine,U: 87.4 mg/dL
Microalb Creat Ratio: 12.4 mg/g (ref 0.0–30.0)
Microalb, Ur: 1.1 mg/dL (ref 0.0–1.9)

## 2023-11-12 LAB — HEMOGLOBIN A1C: Hgb A1c MFr Bld: 6.6 % — ABNORMAL HIGH (ref 4.6–6.5)

## 2023-11-12 NOTE — Progress Notes (Unsigned)
 Vit D low prev, recheck pending.   Diabetes:  No meds.  Hypoglycemic episodes: no sx Hyperglycemic episodes: no sx Feet problems: no Blood Sugars averaging: not checked.  eye exam within last year: yes MALB pending. A1c pending.   Meds, vitals, and allergies reviewed.   ROS: Per HPI unless specifically indicated in ROS section   GEN: nad, alert and oriented HEENT: ncat NECK: supple w/o LA CV: rrr. PULM: ctab, no inc wob ABD: soft, +bs EXT: no edema SKIN: no acute rash  Diabetic foot exam: Normal inspection No skin breakdown No calluses  Normal DP pulses Normal sensation to light touch and monofilament Nails normal

## 2023-11-12 NOTE — Patient Instructions (Signed)
 Go to the lab on the way out.   If you have mychart we'll likely use that to update you.    Take care.  Glad to see you.

## 2023-11-14 DIAGNOSIS — E559 Vitamin D deficiency, unspecified: Secondary | ICD-10-CM | POA: Insufficient documentation

## 2023-11-14 NOTE — Assessment & Plan Note (Signed)
 Vit D low prev, recheck pending.  See notes on labs.

## 2023-11-14 NOTE — Assessment & Plan Note (Signed)
 MALB pending. A1c pending.  See notes on labs.  D/w pt about diet.

## 2024-02-14 DIAGNOSIS — H17822 Peripheral opacity of cornea, left eye: Secondary | ICD-10-CM | POA: Diagnosis not present

## 2024-02-14 DIAGNOSIS — H2513 Age-related nuclear cataract, bilateral: Secondary | ICD-10-CM | POA: Diagnosis not present

## 2024-02-14 DIAGNOSIS — E119 Type 2 diabetes mellitus without complications: Secondary | ICD-10-CM | POA: Diagnosis not present

## 2024-02-14 DIAGNOSIS — H25013 Cortical age-related cataract, bilateral: Secondary | ICD-10-CM | POA: Diagnosis not present

## 2024-02-14 LAB — HM DIABETES EYE EXAM

## 2024-05-11 ENCOUNTER — Emergency Department (HOSPITAL_COMMUNITY)

## 2024-05-11 ENCOUNTER — Other Ambulatory Visit: Payer: Self-pay

## 2024-05-11 ENCOUNTER — Emergency Department (HOSPITAL_COMMUNITY)
Admission: EM | Admit: 2024-05-11 | Discharge: 2024-05-11 | Disposition: A | Discharge status: Home / Self Care | Attending: Emergency Medicine | Admitting: Emergency Medicine

## 2024-05-11 ENCOUNTER — Encounter (HOSPITAL_COMMUNITY): Payer: Self-pay

## 2024-05-11 DIAGNOSIS — W108XXA Fall (on) (from) other stairs and steps, initial encounter: Secondary | ICD-10-CM | POA: Insufficient documentation

## 2024-05-11 DIAGNOSIS — M549 Dorsalgia, unspecified: Secondary | ICD-10-CM | POA: Diagnosis not present

## 2024-05-11 DIAGNOSIS — M533 Sacrococcygeal disorders, not elsewhere classified: Secondary | ICD-10-CM | POA: Diagnosis not present

## 2024-05-11 DIAGNOSIS — Z90710 Acquired absence of both cervix and uterus: Secondary | ICD-10-CM | POA: Diagnosis not present

## 2024-05-11 DIAGNOSIS — S3210XA Unspecified fracture of sacrum, initial encounter for closed fracture: Secondary | ICD-10-CM | POA: Diagnosis not present

## 2024-05-11 DIAGNOSIS — E119 Type 2 diabetes mellitus without complications: Secondary | ICD-10-CM | POA: Diagnosis not present

## 2024-05-11 DIAGNOSIS — S300XXA Contusion of lower back and pelvis, initial encounter: Secondary | ICD-10-CM | POA: Insufficient documentation

## 2024-05-11 DIAGNOSIS — W19XXXA Unspecified fall, initial encounter: Secondary | ICD-10-CM | POA: Diagnosis not present

## 2024-05-11 NOTE — Discharge Instructions (Signed)
 There was no sign of broken bones on the CAT scan but you did bruise the area and it will be sore for several days.  You can take Tylenol and ibuprofen  as needed for the pain.

## 2024-05-11 NOTE — ED Notes (Signed)
 Patient returned from CT

## 2024-05-11 NOTE — ED Notes (Signed)
 Patient transported to CT

## 2024-05-11 NOTE — ED Triage Notes (Signed)
 Pt had a fall yesterday, was seen at [facility] in [place], had an xray done of her lumbar spine that showed a nondisplaced oblique fracture of the 5th sacral segment. Pt c.o buttocks pain. Sent here for CT scan

## 2024-05-12 NOTE — ED Provider Notes (Signed)
 [place] EMERGENCY DEPARTMENT AT [facility] Provider Note   CSN: [id number] Arrival date & time: 05/11/24  1756     Patient presents with: Natalie Rush   Natalie Rush is a 73 y.o. female.   Patient is a 73 year old female with a history of diabetes, hepatitis C, heart murmur and vertigo who is presenting today after she fell yesterday.  Patient reports that she had her arms fall and she was walking up the outdoor steps when she lost her balance and fell backwards down 4 steps onto her buttocks.  She did not hit her head or lose consciousness.  She does not take any anticoagulation.  She has had significant soreness in her buttocks and in her buttocks and the coccyx area.  She has had no difficulty walking denies any numbness or tingling in her legs.  She went to urgent care where they did plain films and there was concern for sacral fracture and sent her there for a CT.  She denies any chest pain, abdominal pain.  She has been able to have bowel movements and urinate without difficulty and has been eating normally.  The history is provided by the patient.  Fall       Prior to Admission medications   Medication Sig Start Date End Date Taking? Authorizing Provider  Calcium Carb-Cholecalciferol (CALCIUM 1000 + D PO) Take 1,800 mg by mouth daily.    [provider]  Cholecalciferol (VITAMIN D3) 50 MCG (2000 UT) capsule Take 1 capsule (2,000 Units total) by mouth daily. 07/16/23   Cleatus Arlyss RAMAN, MD  Magnesium 400 MG CAPS Take by mouth.    [provider]  Melatonin 3 MG TABS Take by mouth at bedtime.    [provider]  zinc gluconate 50 MG tablet Take 50 mg by mouth daily.    [provider]    Allergies: Lortab [hydrocodone-acetaminophen] and Demerol    Review of Systems  Updated Vital Signs BP 114/78   Pulse 73   Temp 98.2 F (36.8 C) (Oral)   Resp 16   Ht 5' 6 (1.676 m)   Wt 59 kg   SpO2 100%   BMI 20.98 kg/m   Physical  Exam Vitals and nursing note reviewed.  Constitutional:      General: She is not in acute distress.    Appearance: She is well-developed.  HENT:     Head: Normocephalic and atraumatic.  Eyes:     Pupils: Pupils are equal, round, and reactive to light.  Cardiovascular:     Rate and Rhythm: Normal rate and regular rhythm.     Heart sounds: Normal heart sounds. No murmur heard.    No friction rub.  Pulmonary:     Effort: Pulmonary effort is normal.     Breath sounds: Normal breath sounds. No wheezing or rales.  Chest:     Chest wall: No tenderness.  Abdominal:     General: Bowel sounds are normal. There is no distension.     Palpations: Abdomen is soft.     Tenderness: There is no abdominal tenderness. There is no right CVA tenderness, left CVA tenderness, guarding or rebound.  Musculoskeletal:        General: Tenderness present. Normal range of motion.       Back:     Comments: No edema.  Tenderness with palpation over the upper buttocks and iliac crest.  Also coccyx tenderness  Skin:    General: Skin is warm and dry.  Findings: No rash.  Neurological:     Mental Status: She is alert and oriented to person, place, and time.     Cranial Nerves: No cranial nerve deficit.     Sensory: No sensory deficit.     Motor: No weakness.  Psychiatric:        Behavior: Behavior normal.     (all labs ordered are listed, but only abnormal results are displayed) Labs Reviewed - No data to display  EKG: None  Radiology: CT PELVIS WO CONTRAST Result Date: 05/11/2024 CLINICAL DATA:  Sacral fracture suspected on radiographs. EXAM: CT PELVIS WITHOUT CONTRAST TECHNIQUE: Multidetector CT imaging of the pelvis was performed following the standard protocol without intravenous contrast. RADIATION DOSE REDUCTION: This exam was performed according to the departmental dose-optimization program which includes automated exposure control, adjustment of the mA and/or kV according to patient size  and/or use of iterative reconstruction technique. COMPARISON:  CT abdomen and pelvis 07/25/2021. No recent radiographic studies are available for comparison. FINDINGS: Urinary Tract:  No abnormality visualized. Bowel:  Unremarkable visualized pelvic bowel loops. Vascular/Lymphatic: No pathologically enlarged lymph nodes. No significant vascular abnormality seen. Reproductive: Surgical absence of the uterus. No abnormal pelvic mass or lymphadenopathy. Other: No free air or free fluid in the pelvis. Pelvic musculature appears intact. Musculoskeletal: Mild degenerative changes in the lower lumbar spine. Sacrum appears intact. No acute fracture or stress reaction identified. Pelvis and hips appear intact. No focal bone lesion or bone destruction. IMPRESSION: 1. No acute bony abnormalities demonstrated.  Sacrum appears intact. 2. No acute process demonstrated in the soft tissue pelvis. Electronically Signed   By: Elsie Gravely M.D.   On: 05/11/2024 19:36     Procedures   Medications Ordered in the ED - No data to display                                  Medical Decision Making Amount and/or Complexity of Data Reviewed Radiology: ordered and independent interpretation performed. Decision-making details documented in ED Course.   Patient presenting here for CT imaging to ensure no other acute fractures.  Has reports for plain imaging of the L-spine that showed concern for possible sacral fracture.  Patient is neurovascularly intact and ambulating without difficulty.  She takes no anticoagulation. I have independently visualized and interpreted pt's images today.  CT without acute findings today.  Radiology reports the sacrum appears intact with no acute fracture and no other acute process noted.  Findings discussed with the patient at this time she is stable for discharge.  She will continue supportive care at home.      Final diagnoses:  Fall, initial encounter  Contusion of coccyx, initial  encounter    ED Discharge Orders     None          Doretha Folks, MD 05/12/24 [phone]

## 2024-05-27 ENCOUNTER — Encounter: Payer: Self-pay | Admitting: Family Medicine

## 2024-05-27 ENCOUNTER — Ambulatory Visit (INDEPENDENT_AMBULATORY_CARE_PROVIDER_SITE_OTHER): Admitting: Family Medicine

## 2024-05-27 VITALS — BP 118/68 | HR 69 | Temp 98.2°F | Ht 64.92 in | Wt 137.6 lb

## 2024-05-27 DIAGNOSIS — E559 Vitamin D deficiency, unspecified: Secondary | ICD-10-CM

## 2024-05-27 DIAGNOSIS — Z7189 Other specified counseling: Secondary | ICD-10-CM

## 2024-05-27 DIAGNOSIS — E119 Type 2 diabetes mellitus without complications: Secondary | ICD-10-CM

## 2024-05-27 DIAGNOSIS — Z0000 Encounter for general adult medical examination without abnormal findings: Secondary | ICD-10-CM

## 2024-05-27 LAB — COMPREHENSIVE METABOLIC PANEL WITH GFR
ALT: 11 U/L (ref 0–35)
AST: 20 U/L (ref 0–37)
Albumin: 4.3 g/dL (ref 3.5–5.2)
Alkaline Phosphatase: 62 U/L (ref 39–117)
BUN: 14 mg/dL (ref 6–23)
CO2: 31 meq/L (ref 19–32)
Calcium: 10.1 mg/dL (ref 8.4–10.5)
Chloride: 105 meq/L (ref 96–112)
Creatinine, Ser: 0.73 mg/dL (ref 0.40–1.20)
GFR: 82.82 mL/min
Glucose, Bld: 76 mg/dL (ref 70–99)
Potassium: 4.3 meq/L (ref 3.5–5.1)
Sodium: 143 meq/L (ref 135–145)
Total Bilirubin: 0.4 mg/dL (ref 0.2–1.2)
Total Protein: 6.8 g/dL (ref 6.0–8.3)

## 2024-05-27 LAB — CBC WITH DIFFERENTIAL/PLATELET
Basophils Absolute: 0 K/uL (ref 0.0–0.1)
Basophils Relative: 0.5 % (ref 0.0–3.0)
Eosinophils Absolute: 0.1 K/uL (ref 0.0–0.7)
Eosinophils Relative: 1.3 % (ref 0.0–5.0)
HCT: 39.5 % (ref 36.0–46.0)
Hemoglobin: 13 g/dL (ref 12.0–15.0)
Lymphocytes Relative: 29.3 % (ref 12.0–46.0)
Lymphs Abs: 1.7 K/uL (ref 0.7–4.0)
MCHC: 32.9 g/dL (ref 30.0–36.0)
MCV: 92.7 fl (ref 78.0–100.0)
Monocytes Absolute: 0.4 K/uL (ref 0.1–1.0)
Monocytes Relative: 7.6 % (ref 3.0–12.0)
Neutro Abs: 3.5 K/uL (ref 1.4–7.7)
Neutrophils Relative %: 61.3 % (ref 43.0–77.0)
Platelets: 156 K/uL (ref 150.0–400.0)
RBC: 4.26 Mil/uL (ref 3.87–5.11)
RDW: 13.5 % (ref 11.5–15.5)
WBC: 5.7 K/uL (ref 4.0–10.5)

## 2024-05-27 LAB — LIPID PANEL
Cholesterol: 170 mg/dL (ref 0–200)
HDL: 54.5 mg/dL
LDL Cholesterol: 81 mg/dL (ref 0–99)
NonHDL: 115.4
Total CHOL/HDL Ratio: 3
Triglycerides: 171 mg/dL — ABNORMAL HIGH (ref 0.0–149.0)
VLDL: 34.2 mg/dL (ref 0.0–40.0)

## 2024-05-27 LAB — MICROALBUMIN / CREATININE URINE RATIO
Creatinine,U: 93.9 mg/dL
Microalb Creat Ratio: 10.1 mg/g (ref 0.0–30.0)
Microalb, Ur: 1 mg/dL (ref 0.0–1.9)

## 2024-05-27 LAB — TSH: TSH: 2.98 u[IU]/mL (ref 0.35–5.50)

## 2024-05-27 LAB — HEMOGLOBIN A1C: Hgb A1c MFr Bld: 6.9 % — ABNORMAL HIGH (ref 4.6–6.5)

## 2024-05-27 LAB — VITAMIN D 25 HYDROXY (VIT D DEFICIENCY, FRACTURES): VITD: 52.32 ng/mL (ref 30.00–100.00)

## 2024-05-27 NOTE — Progress Notes (Unsigned)
 CPE- See plan.  Routine anticipatory guidance given to patient.  See health maintenance.  The possibility exists that previously documented standard health maintenance information may have been brought forward from a previous encounter into this note.  If needed, that same information has been updated to reflect the current situation based on today's encounter.    Flu shot encouraged. Declined.  Tetanus 2010, dw pt.  Covid d/w pt.  Declined PNA d/w pt.  Declined Shingles d/w pt.  Declined Mammogram d/w pt. Encouraged.  Declined DXA dw pt.  Declined Cologuard neg 2024.   Living will d/w pt.  Sister Maybelle designated if patient were incapacitated, then Rosaline Shallow if needed.     Diabetes:  No meds.  Hypoglycemic episodes:no Hyperglycemic episodes:no Feet problems:no  Blood Sugars averaging: 90-100s in the AMs, slightly higher later in the day eye exam within last year: yes, [facility].  She cut back on carbs in the meantime.  Labs pending.   PMH and SH reviewed  Meds, vitals, and allergies reviewed.   ROS: Per HPI.  Unless specifically indicated otherwise in HPI, the patient denies:  General: fever. Eyes: acute vision changes ENT: sore throat Cardiovascular: chest pain Respiratory: SOB GI: vomiting GU: dysuria Musculoskeletal: acute back pain Derm: acute rash Neuro: acute motor dysfunction Psych: worsening mood Endocrine: polydipsia Heme: bleeding Allergy: hayfever  GEN: nad, alert and oriented HEENT: mucous membranes moist NECK: supple w/o LA CV: rrr. PULM: ctab, no inc wob ABD: soft, +bs EXT: no edema SKIN: no acute rash  Diabetic foot exam: Normal inspection No skin breakdown No calluses  Normal DP pulses Normal sensation to light touch and monofilament Nails normal

## 2024-05-27 NOTE — Patient Instructions (Signed)
 Go to the lab on the way out.   If you have mychart we'll likely use that to update you.    We'll make plans when I see your labs.  I would get a flu shot each fall.   Take care.  Glad to see you.

## 2024-05-28 ENCOUNTER — Ambulatory Visit: Payer: Self-pay | Admitting: Family Medicine

## 2024-05-28 NOTE — Assessment & Plan Note (Signed)
 No meds at this point.  See notes on labs. She cut back on carbs in the meantime.  Labs pending.

## 2024-05-28 NOTE — Assessment & Plan Note (Signed)
 Living will d/w pt.  Sister Maybelle designated if patient were incapacitated, then Rosaline Shallow if needed.

## 2024-05-28 NOTE — Assessment & Plan Note (Signed)
 Flu shot encouraged. Declined.  Tetanus 2010, dw pt.  Covid d/w pt.  Declined PNA d/w pt.  Declined Shingles d/w pt.  Declined Mammogram d/w pt. Encouraged.  Declined DXA dw pt.  Declined Cologuard neg 2024.   Living will d/w pt.  Sister Maybelle designated if patient were incapacitated, then Rosaline Shallow if needed.

## 2024-05-29 NOTE — Telephone Encounter (Signed)
 Copied from CRM [id number]. Topic: Clinical - Lab/Test Results >> May 29, 2024 11:54 AM Chiquita SQUIBB wrote: Reason for CRM: Patient is returning a call for lab work, relayed lab work to the patient and scheduled 4 month follow up.

## 2024-06-08 DIAGNOSIS — R350 Frequency of micturition: Secondary | ICD-10-CM | POA: Diagnosis not present

## 2024-06-08 DIAGNOSIS — R300 Dysuria: Secondary | ICD-10-CM | POA: Diagnosis not present

## 2024-09-29 ENCOUNTER — Ambulatory Visit: Admitting: Family Medicine

## 2024-10-02 ENCOUNTER — Ambulatory Visit: Admitting: Family Medicine

## 2024-10-06 ENCOUNTER — Ambulatory Visit: Admitting: Family Medicine

## 2024-10-07 ENCOUNTER — Ambulatory Visit: Admitting: Family Medicine

## 2024-10-20 ENCOUNTER — Ambulatory Visit: Admitting: Family Medicine

## 2024-11-07 ENCOUNTER — Ambulatory Visit: Admitting: Family Medicine
